# Patient Record
Sex: Male | Born: 1995 | Race: White | Hispanic: No | Marital: Single | Smoking: Current every day smoker
Health system: Southern US, Community
[De-identification: ages and names within clinical notes are randomized; demographics above are authoritative.]

---
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
## 2016-02-17 ENCOUNTER — Ambulatory Visit (HOSPITAL_COMMUNITY)
Admission: EM | Admit: 2016-02-17 | Discharge: 2016-02-17 | Disposition: A | Discharge status: Home / Self Care | Payer: 59 | Attending: Family Medicine | Admitting: Family Medicine

## 2016-02-17 ENCOUNTER — Encounter (HOSPITAL_COMMUNITY): Payer: Self-pay | Admitting: *Deleted

## 2016-02-17 DIAGNOSIS — A084 Viral intestinal infection, unspecified: Secondary | ICD-10-CM | POA: Diagnosis not present

## 2016-02-17 MED ORDER — DIPHENOXYLATE-ATROPINE 2.5-0.025 MG PO TABS: 2.0000 | ORAL_TABLET | Freq: Four times a day (QID) | ORAL | 0 refills | Status: DC | PRN

## 2016-02-17 MED ORDER — ONDANSETRON 4 MG PO TBDP
ORAL_TABLET | ORAL | Status: AC
  Filled 2016-02-17: qty 2

## 2016-02-17 MED ORDER — ONDANSETRON 4 MG PO TBDP
8.0000 mg | ORAL_TABLET | Freq: Once | ORAL | Status: AC
  Administered 2016-02-17: 8 mg via ORAL

## 2016-02-17 MED ORDER — ONDANSETRON HCL 4 MG PO TABS: 4.0000 mg | ORAL_TABLET | Freq: Four times a day (QID) | ORAL | 0 refills | Status: DC

## 2016-02-17 MED FILL — DIPHENOXYLATE-ATROP 2.5-0.0: 2.5-0.025 | 2 days supply | Qty: 20 | Fill #0

## 2016-02-17 MED FILL — ONDANSETRON HCL 4 MG TABLET: 4 | 2 days supply | Qty: 6 | Fill #0

## 2016-02-17 NOTE — ED Triage Notes (Signed)
Pt  Has  Symptoms  Of  Nausea  Vomiting /  Diarrhea  With  Onset  Of     Symptoms      yest     No active  Vomiting noted  At this  Time  Pt  Is   Sitting upright  On  The  Exam table  Speaking  In   complete sentannces   In no severe  Acute  Distress

## 2016-02-17 NOTE — Discharge Instructions (Signed)
Clear liquid , bland diet tonight as tolerated, advance on wed as improved, use medicine as needed, return or see your doctor if any problems. °

## 2016-02-17 NOTE — ED Provider Notes (Signed)
[facility]    CSN: [id number] Arrival date & time: 02/17/16  1305     History   Chief Complaint Chief Complaint  Patient presents with  . Nausea    HPI Jenelle MagesDylan M Gootee is a 20 y.o. male.   The history is provided by the patient.  Emesis  Severity:  Mild Duration:  12 hours Quality:  Stomach contents Progression:  Improving Chronicity:  New Relieved by:  Nothing Worsened by:  Nothing Ineffective treatments:  None tried Associated symptoms: diarrhea   Associated symptoms: no abdominal pain, no cough and no fever   Risk factors: sick contacts   Risk factors comment:  Sev sick family members.   History reviewed. No pertinent past medical history.  There are no active problems to display for this patient.   No past surgical history on file.     Home Medications    Prior to Admission medications   Not on File    Family History No family history on file.  Social History Social History  Substance Use Topics  . Smoking status: Not on file  . Smokeless tobacco: Not on file  . Alcohol use Not on file     Allergies   Review of patient's allergies indicates no known allergies.   Review of Systems Review of Systems  Constitutional: Negative for fever.  Respiratory: Negative for cough.   Gastrointestinal: Positive for diarrhea, nausea and vomiting. Negative for abdominal pain and blood in stool.     Physical Exam Triage Vital Signs ED Triage Vitals [02/17/16 1336]  Enc Vitals Group     BP 132/78     Pulse Rate 78     Resp 18     Temp 98.6 F (37 C)     Temp Source Oral     SpO2 98 %     Weight      Height      Head Circumference      Peak Flow      Pain Score      Pain Loc      Pain Edu?      Excl. in GC?    No data found.   Updated Vital Signs BP 132/78 (BP Location: Right Arm)   Pulse 78   Temp 98.6 F (37 C) (Oral)   Resp 18   SpO2 98%   Visual Acuity Right Eye Distance:   Left Eye Distance:   Bilateral  Distance:    Right Eye Near:   Left Eye Near:    Bilateral Near:     Physical Exam  Constitutional: He appears well-developed and well-nourished.  HENT:  Mouth/Throat: Oropharynx is clear and moist.  Cardiovascular: Normal rate, regular rhythm and normal heart sounds.   Pulmonary/Chest: Effort normal and breath sounds normal.  Abdominal: Soft. Bowel sounds are normal. He exhibits no distension and no mass. There is no tenderness. There is no guarding.  Lymphadenopathy:    He has no cervical adenopathy.  Skin: Skin is warm and dry.  Nursing note and vitals reviewed.    UC Treatments / Results  Labs (all labs ordered are listed, but only abnormal results are displayed) Labs Reviewed - No data to display  EKG  EKG Interpretation None       Radiology No results found.  Procedures Procedures (including critical care time)  Medications Ordered in UC Medications - No data to display   Initial Impression / Assessment and Plan / UC Course  I have reviewed  the triage vital signs and the nursing notes.  Pertinent labs & imaging results that were available during my care of the patient were reviewed by me and considered in my medical decision making (see chart for details).  Clinical Course      Final Clinical Impressions(s) / UC Diagnoses   Final diagnoses:  None    New Prescriptions New Prescriptions   No medications on file     Linna Hoff, MD 02/17/16 [phone]

## 2016-04-20 ENCOUNTER — Emergency Department (HOSPITAL_COMMUNITY)
Admission: EM | Admit: 2016-04-20 | Discharge: 2016-04-20 | Disposition: A | Discharge status: Home / Self Care | Payer: 59 | Attending: Emergency Medicine | Admitting: Emergency Medicine

## 2016-04-20 ENCOUNTER — Encounter (HOSPITAL_COMMUNITY): Payer: Self-pay | Admitting: Emergency Medicine

## 2016-04-20 DIAGNOSIS — R1012 Left upper quadrant pain: Secondary | ICD-10-CM | POA: Insufficient documentation

## 2016-04-20 DIAGNOSIS — Z5321 Procedure and treatment not carried out due to patient leaving prior to being seen by health care provider: Secondary | ICD-10-CM | POA: Insufficient documentation

## 2016-04-20 DIAGNOSIS — F17200 Nicotine dependence, unspecified, uncomplicated: Secondary | ICD-10-CM | POA: Diagnosis not present

## 2016-04-20 DIAGNOSIS — R1110 Vomiting, unspecified: Secondary | ICD-10-CM | POA: Diagnosis not present

## 2016-04-20 DIAGNOSIS — R197 Diarrhea, unspecified: Secondary | ICD-10-CM | POA: Insufficient documentation

## 2016-04-20 LAB — COMPREHENSIVE METABOLIC PANEL
ALT: 27 U/L (ref 17–63)
AST: 25 U/L (ref 15–41)
Albumin: 4.7 g/dL (ref 3.5–5.0)
Alkaline Phosphatase: 58 U/L (ref 38–126)
Anion gap: 8 (ref 5–15)
BILIRUBIN TOTAL: 1 mg/dL (ref 0.3–1.2)
BUN: 9 mg/dL (ref 6–20)
CO2: 27 mmol/L (ref 22–32)
CREATININE: 1.01 mg/dL (ref 0.61–1.24)
Calcium: 9.8 mg/dL (ref 8.9–10.3)
Chloride: 105 mmol/L (ref 101–111)
Glucose, Bld: 92 mg/dL (ref 65–99)
POTASSIUM: 3.7 mmol/L (ref 3.5–5.1)
Sodium: 140 mmol/L (ref 135–145)
TOTAL PROTEIN: 7.3 g/dL (ref 6.5–8.1)

## 2016-04-20 LAB — CBC
HCT: 46.8 % (ref 39.0–52.0)
Hemoglobin: 16.6 g/dL (ref 13.0–17.0)
MCH: 30.6 pg (ref 26.0–34.0)
MCHC: 35.5 g/dL (ref 30.0–36.0)
MCV: 86.2 fL (ref 78.0–100.0)
PLATELETS: 267 10*3/uL (ref 150–400)
RBC: 5.43 MIL/uL (ref 4.22–5.81)
RDW: 12.3 % (ref 11.5–15.5)
WBC: 11 10*3/uL — AB (ref 4.0–10.5)

## 2016-04-20 LAB — LIPASE, BLOOD: Lipase: 21 U/L (ref 11–51)

## 2016-04-20 NOTE — ED Triage Notes (Signed)
Patient presents with multiple complaints : chronic intermittent emesis with acid reflux for >1 month , LUQ pain this week , bloody emesis today with diarrhea and fatigue , denies fever or chills .

## 2016-04-20 NOTE — ED Notes (Signed)
Pt friend at desk wanting to know about wait time. Updated on status and about labs being ran and such. Stated that they would not keep waiting.

## 2016-04-20 NOTE — ED Notes (Signed)
Pt friend at desk again asking for an update. Updated on wait times, pt walked out and stated that he was "leaving to charge his mom".

## 2016-04-21 DIAGNOSIS — K219 Gastro-esophageal reflux disease without esophagitis: Secondary | ICD-10-CM | POA: Diagnosis not present

## 2016-04-21 DIAGNOSIS — R634 Abnormal weight loss: Secondary | ICD-10-CM | POA: Diagnosis not present

## 2016-04-21 DIAGNOSIS — R112 Nausea with vomiting, unspecified: Secondary | ICD-10-CM | POA: Diagnosis not present

## 2016-04-29 DIAGNOSIS — R112 Nausea with vomiting, unspecified: Secondary | ICD-10-CM | POA: Diagnosis not present

## 2016-04-29 DIAGNOSIS — K208 Other esophagitis: Secondary | ICD-10-CM | POA: Diagnosis not present

## 2016-04-29 DIAGNOSIS — R634 Abnormal weight loss: Secondary | ICD-10-CM | POA: Diagnosis not present

## 2016-04-29 DIAGNOSIS — K219 Gastro-esophageal reflux disease without esophagitis: Secondary | ICD-10-CM | POA: Diagnosis not present

## 2016-05-03 ENCOUNTER — Emergency Department (HOSPITAL_COMMUNITY): Payer: 59

## 2016-05-03 ENCOUNTER — Encounter (HOSPITAL_COMMUNITY): Payer: Self-pay | Admitting: Emergency Medicine

## 2016-05-03 ENCOUNTER — Emergency Department (HOSPITAL_COMMUNITY)
Admission: EM | Admit: 2016-05-03 | Discharge: 2016-05-04 | Disposition: A | Discharge status: Home / Self Care | Payer: 59 | Attending: Emergency Medicine | Admitting: Emergency Medicine

## 2016-05-03 DIAGNOSIS — S62306A Unspecified fracture of fifth metacarpal bone, right hand, initial encounter for closed fracture: Secondary | ICD-10-CM

## 2016-05-03 DIAGNOSIS — Y939 Activity, unspecified: Secondary | ICD-10-CM | POA: Insufficient documentation

## 2016-05-03 DIAGNOSIS — S62326A Displaced fracture of shaft of fifth metacarpal bone, right hand, initial encounter for closed fracture: Secondary | ICD-10-CM | POA: Diagnosis not present

## 2016-05-03 DIAGNOSIS — W2201XA Walked into wall, initial encounter: Secondary | ICD-10-CM | POA: Diagnosis not present

## 2016-05-03 DIAGNOSIS — F1910 Other psychoactive substance abuse, uncomplicated: Secondary | ICD-10-CM

## 2016-05-03 DIAGNOSIS — Y929 Unspecified place or not applicable: Secondary | ICD-10-CM | POA: Diagnosis not present

## 2016-05-03 DIAGNOSIS — Z79899 Other long term (current) drug therapy: Secondary | ICD-10-CM | POA: Insufficient documentation

## 2016-05-03 DIAGNOSIS — S62354A Nondisplaced fracture of shaft of fourth metacarpal bone, right hand, initial encounter for closed fracture: Secondary | ICD-10-CM | POA: Diagnosis not present

## 2016-05-03 DIAGNOSIS — S62356A Nondisplaced fracture of shaft of fifth metacarpal bone, right hand, initial encounter for closed fracture: Secondary | ICD-10-CM | POA: Diagnosis not present

## 2016-05-03 DIAGNOSIS — S6991XA Unspecified injury of right wrist, hand and finger(s), initial encounter: Secondary | ICD-10-CM | POA: Diagnosis present

## 2016-05-03 DIAGNOSIS — F1994 Other psychoactive substance use, unspecified with psychoactive substance-induced mood disorder: Secondary | ICD-10-CM

## 2016-05-03 DIAGNOSIS — F10129 Alcohol abuse with intoxication, unspecified: Secondary | ICD-10-CM | POA: Diagnosis not present

## 2016-05-03 DIAGNOSIS — F17200 Nicotine dependence, unspecified, uncomplicated: Secondary | ICD-10-CM | POA: Insufficient documentation

## 2016-05-03 DIAGNOSIS — Y999 Unspecified external cause status: Secondary | ICD-10-CM | POA: Insufficient documentation

## 2016-05-03 DIAGNOSIS — F6381 Intermittent explosive disorder: Secondary | ICD-10-CM

## 2016-05-03 DIAGNOSIS — F10120 Alcohol abuse with intoxication, uncomplicated: Secondary | ICD-10-CM | POA: Insufficient documentation

## 2016-05-03 DIAGNOSIS — F29 Unspecified psychosis not due to a substance or known physiological condition: Secondary | ICD-10-CM | POA: Diagnosis not present

## 2016-05-03 DIAGNOSIS — F10920 Alcohol use, unspecified with intoxication, uncomplicated: Secondary | ICD-10-CM

## 2016-05-03 LAB — CBC
HEMATOCRIT: 48.8 % (ref 39.0–52.0)
Hemoglobin: 17.3 g/dL — ABNORMAL HIGH (ref 13.0–17.0)
MCH: 29.5 pg (ref 26.0–34.0)
MCHC: 35.5 g/dL (ref 30.0–36.0)
MCV: 83.3 fL (ref 78.0–100.0)
PLATELETS: 327 10*3/uL (ref 150–400)
RBC: 5.86 MIL/uL — ABNORMAL HIGH (ref 4.22–5.81)
RDW: 12.6 % (ref 11.5–15.5)
WBC: 15.9 10*3/uL — AB (ref 4.0–10.5)

## 2016-05-03 LAB — SALICYLATE LEVEL: Salicylate Lvl: <7 mg/dL (ref 2.8–30.0)

## 2016-05-03 LAB — COMPREHENSIVE METABOLIC PANEL
ALK PHOS: 71 U/L (ref 38–126)
ALT: 34 U/L (ref 17–63)
AST: 38 U/L (ref 15–41)
Albumin: 5.4 g/dL — ABNORMAL HIGH (ref 3.5–5.0)
Anion gap: 14 (ref 5–15)
BUN: 9 mg/dL (ref 6–20)
CALCIUM: 9.7 mg/dL (ref 8.9–10.3)
CO2: 21 mmol/L — ABNORMAL LOW (ref 22–32)
CREATININE: 0.87 mg/dL (ref 0.61–1.24)
Chloride: 104 mmol/L (ref 101–111)
GFR calc Af Amer: >60 mL/min (ref >60)
Glucose, Bld: 88 mg/dL (ref 65–99)
POTASSIUM: 3.6 mmol/L (ref 3.5–5.1)
Sodium: 139 mmol/L (ref 135–145)
TOTAL PROTEIN: 8.2 g/dL — AB (ref 6.5–8.1)
Total Bilirubin: 1.6 mg/dL — ABNORMAL HIGH (ref 0.3–1.2)

## 2016-05-03 LAB — ACETAMINOPHEN LEVEL: Acetaminophen (Tylenol), Serum: <10 ug/mL — ABNORMAL LOW (ref 10–30)

## 2016-05-03 LAB — ETHANOL: ALCOHOL ETHYL (B): 139 mg/dL — AB (ref <5)

## 2016-05-03 MED ORDER — LORAZEPAM 2 MG/ML IJ SOLN: 2.0000 mg | INTRAMUSCULAR | Status: DC | PRN

## 2016-05-03 MED ORDER — TRAMADOL HCL 50 MG PO TABS
50.0000 mg | ORAL_TABLET | Freq: Four times a day (QID) | ORAL | Status: DC | PRN
  Administered 2016-05-03 – 2016-05-04 (×2): 50 mg via ORAL
  Filled 2016-05-03 (×2): qty 1

## 2016-05-03 MED ORDER — OXYCODONE-ACETAMINOPHEN 5-325 MG PO TABS
2.0000 | ORAL_TABLET | Freq: Once | ORAL | Status: AC
Start: 2016-05-03 — End: 2016-05-03
  Administered 2016-05-03: 2 via ORAL
  Filled 2016-05-03: qty 2

## 2016-05-03 MED ORDER — ZIPRASIDONE MESYLATE 20 MG IM SOLR
20.0000 mg | INTRAMUSCULAR | Status: DC | PRN
Start: 2016-05-03 — End: 2016-05-04
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
## 2016-05-03 NOTE — ED Notes (Signed)
Mom is Tina Harris-[phone]

## 2016-05-03 NOTE — ED Notes (Signed)
Patient is suddenly in more pain.  Elevated patient's hand on pillows and 2 ice bags placed.  Patient rates his pain as a 10.
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
## 2016-05-03 NOTE — ED Notes (Signed)
Patient allowed nurse to draw blood. Patient said," I don't want to be the fuck here!"  "How long am I going to be here?"  [place] [employer] and security standing by.  Patient reports he has been doing ETOH, weed, and cocaine.

## 2016-05-03 NOTE — ED Notes (Signed)
Patient sleeping after visiting with his father.

## 2016-05-03 NOTE — ED Notes (Signed)
Patient resting in bed at this time. Eyes closed, even rise and fall of chest. NAD noted at this time. 

## 2016-05-03 NOTE — ED Notes (Signed)
Patient gave permission for nurse to give Mom information about his condition.

## 2016-05-03 NOTE — ED Notes (Signed)
Dad is William LemmingsBrian Rollins- [phone]

## 2016-05-03 NOTE — ED Notes (Signed)
Offered meal tray to patient but he refused.

## 2016-05-03 NOTE — ED Notes (Signed)
Patient aware that a urine sample is needed 

## 2016-05-04 ENCOUNTER — Encounter (HOSPITAL_COMMUNITY): Payer: Self-pay | Admitting: *Deleted

## 2016-05-04 ENCOUNTER — Inpatient Hospital Stay (HOSPITAL_COMMUNITY)
Admission: AD | Admit: 2016-05-04 | Discharge: 2016-05-05 | DRG: 897 | Disposition: A | Discharge status: Home / Self Care | Payer: 59 | Attending: Psychiatry | Admitting: Psychiatry

## 2016-05-04 DIAGNOSIS — F329 Major depressive disorder, single episode, unspecified: Secondary | ICD-10-CM | POA: Diagnosis present

## 2016-05-04 DIAGNOSIS — F17200 Nicotine dependence, unspecified, uncomplicated: Secondary | ICD-10-CM | POA: Diagnosis present

## 2016-05-04 DIAGNOSIS — S62356A Nondisplaced fracture of shaft of fifth metacarpal bone, right hand, initial encounter for closed fracture: Secondary | ICD-10-CM | POA: Diagnosis not present

## 2016-05-04 DIAGNOSIS — F19980 Other psychoactive substance use, unspecified with psychoactive substance-induced anxiety disorder: Secondary | ICD-10-CM | POA: Diagnosis not present

## 2016-05-04 DIAGNOSIS — W2209XD Striking against other stationary object, subsequent encounter: Secondary | ICD-10-CM | POA: Diagnosis not present

## 2016-05-04 DIAGNOSIS — F29 Unspecified psychosis not due to a substance or known physiological condition: Secondary | ICD-10-CM | POA: Diagnosis not present

## 2016-05-04 DIAGNOSIS — F1210 Cannabis abuse, uncomplicated: Secondary | ICD-10-CM | POA: Diagnosis not present

## 2016-05-04 DIAGNOSIS — S62354A Nondisplaced fracture of shaft of fourth metacarpal bone, right hand, initial encounter for closed fracture: Secondary | ICD-10-CM | POA: Diagnosis not present

## 2016-05-04 DIAGNOSIS — F1994 Other psychoactive substance use, unspecified with psychoactive substance-induced mood disorder: Secondary | ICD-10-CM | POA: Diagnosis present

## 2016-05-04 DIAGNOSIS — Z79899 Other long term (current) drug therapy: Secondary | ICD-10-CM

## 2016-05-04 DIAGNOSIS — F19920 Other psychoactive substance use, unspecified with intoxication, uncomplicated: Secondary | ICD-10-CM | POA: Clinically undetermined

## 2016-05-04 DIAGNOSIS — F1010 Alcohol abuse, uncomplicated: Secondary | ICD-10-CM | POA: Clinically undetermined

## 2016-05-04 DIAGNOSIS — F10120 Alcohol abuse with intoxication, uncomplicated: Secondary | ICD-10-CM | POA: Diagnosis not present

## 2016-05-04 DIAGNOSIS — F1410 Cocaine abuse, uncomplicated: Secondary | ICD-10-CM | POA: Diagnosis not present

## 2016-05-04 DIAGNOSIS — S62309A Unspecified fracture of unspecified metacarpal bone, initial encounter for closed fracture: Secondary | ICD-10-CM | POA: Diagnosis present

## 2016-05-04 DIAGNOSIS — S62306A Unspecified fracture of fifth metacarpal bone, right hand, initial encounter for closed fracture: Secondary | ICD-10-CM | POA: Diagnosis not present

## 2016-05-04 DIAGNOSIS — S62309D Unspecified fracture of unspecified metacarpal bone, subsequent encounter for fracture with routine healing: Secondary | ICD-10-CM

## 2016-05-04 DIAGNOSIS — S62304A Unspecified fracture of fourth metacarpal bone, right hand, initial encounter for closed fracture: Secondary | ICD-10-CM | POA: Diagnosis not present

## 2016-05-04 DIAGNOSIS — F6381 Intermittent explosive disorder: Secondary | ICD-10-CM

## 2016-05-04 DIAGNOSIS — F14159 Cocaine abuse with cocaine-induced psychotic disorder, unspecified: Secondary | ICD-10-CM | POA: Diagnosis present

## 2016-05-04 DIAGNOSIS — Y906 Blood alcohol level of 120-199 mg/100 ml: Secondary | ICD-10-CM | POA: Diagnosis present

## 2016-05-04 DIAGNOSIS — F1910 Other psychoactive substance abuse, uncomplicated: Secondary | ICD-10-CM

## 2016-05-04 LAB — RAPID URINE DRUG SCREEN, HOSP PERFORMED
AMPHETAMINES: NOT DETECTED
BENZODIAZEPINES: NOT DETECTED
Barbiturates: NOT DETECTED
COCAINE: POSITIVE — AB
OPIATES: NOT DETECTED
TETRAHYDROCANNABINOL: POSITIVE — AB

## 2016-05-04 MED ORDER — IBUPROFEN 800 MG PO TABS
800.0000 mg | ORAL_TABLET | Freq: Three times a day (TID) | ORAL | Status: DC
  Administered 2016-05-05: 800 mg via ORAL
  Filled 2016-05-04 (×9): qty 1

## 2016-05-04 MED ORDER — ACETAMINOPHEN 325 MG PO TABS: 650.0000 mg | ORAL_TABLET | Freq: Four times a day (QID) | ORAL | Status: DC | PRN

## 2016-05-04 MED ORDER — MAGNESIUM HYDROXIDE 400 MG/5ML PO SUSP: 30.0000 mL | Freq: Every day | ORAL | Status: DC | PRN

## 2016-05-04 MED ORDER — TRAMADOL HCL 50 MG PO TABS
50.0000 mg | ORAL_TABLET | Freq: Four times a day (QID) | ORAL | Status: DC | PRN
  Administered 2016-05-04 – 2016-05-05 (×2): 50 mg via ORAL
  Filled 2016-05-04 (×2): qty 1

## 2016-05-04 MED ORDER — ALUM & MAG HYDROXIDE-SIMETH 200-200-20 MG/5ML PO SUSP: 30.0000 mL | ORAL | Status: DC | PRN

## 2016-05-04 MED ORDER — GABAPENTIN 100 MG PO CAPS
200.0000 mg | ORAL_CAPSULE | Freq: Two times a day (BID) | ORAL | Status: DC
  Administered 2016-05-04: 200 mg via ORAL
  Filled 2016-05-04: qty 2

## 2016-05-04 MED ORDER — IBUPROFEN 600 MG PO TABS
600.0000 mg | ORAL_TABLET | Freq: Four times a day (QID) | ORAL | Status: DC
  Administered 2016-05-04: 600 mg via ORAL
  Filled 2016-05-04 (×4): qty 1
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
## 2016-05-04 NOTE — [facility] Suicide Risk Assessment (Signed)
[facility] INPATIENT:  Family/Significant Other Suicide Prevention Education  Suicide Prevention Education:  Education Completed; William LemmingsBrian Delancey (pt's father) [phone] has been identified by the patient as the family member/significant other with whom the patient will be residing, and identified as the person(s) who will aid the patient in the event of a mental health crisis (suicidal ideations/suicide attempt).  With written consent from the patient, the family member/significant other has been provided the following suicide prevention education, prior to the and/or following the discharge of the patient.  The suicide prevention education provided includes the following:  Suicide risk factors  Suicide prevention and interventions  National Suicide Hotline telephone number  [facility] assessment telephone number  [facility] Emergency Assistance 911  [facility] and/or [facility] telephone number  Request made of family/significant other to:  Remove weapons (e.g., guns, rifles, knives), all items previously/currently identified as safety concern.    Remove drugs/medications (over-the-counter, prescriptions, illicit drugs), all items previously/currently identified as a safety concern.  The family member/significant other verbalizes understanding of the suicide prevention education information provided.  The family member/significant other agrees to remove the items of safety concern listed above.  Pt's father reports that he is comfortable with patient discharging on Wednesday and reports no concerns regarding SI/HI or behavioral issues. "He's a good kid. He made a mistake and he knows it." Patient's father is willing to pick him up after lunch on Wednesday. He is aware that pt is not interested in outpatient treatment. Patient has been given [employer] information and hospital note for work.   Natallie Ravenscroft N Smart LCSW 05/04/2016, 2:37 PM
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
## 2016-05-04 NOTE — ED Notes (Addendum)
[employer] called for transport to behavioral health
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
## 2016-05-04 NOTE — BH Assessment (Signed)
BHH Assessment Progress Note  Per Thedore MinsMojeed Akintayo, MD, this pt requires psychiatric hospitalization.  Berneice Heinrichina Tate, RN, [place] [facility]  Pt presents under IVC initiated by his father, and upheld by EDP Mancel BaleElliott Wentz, MD, and [facility].  Pt's nurse has been notified, and agrees to call report to [phone].  Pt is to be transported via [employer].   Doylene Canninghomas Katilin Raynes, MA Triage Specialist [phone]

## 2016-05-04 NOTE — ED Notes (Signed)
Patient up, awake alert and oriented at this time. Patient ambulatory to and from restroom without deficit. No needs voiced at this time.

## 2016-05-04 NOTE — ED Notes (Signed)
Patient ambulatory with [employer] at discharge to behavioral health.

## 2016-05-04 NOTE — ED Notes (Signed)
Patient remains asleep, eyes closed. Even rise and fall of chest. NAD noted at this time

## 2016-05-04 NOTE — Progress Notes (Signed)
Recreation Therapy Notes  Animal-Assisted Activity (AAA) Program Checklist/Progress Notes Patient Eligibility Criteria Checklist & Daily Group note for Rec TxIntervention  Date: 01.02.2018 Time: 2:45pm [address]    AAA/T Program Assumption of Risk Form signed by Patient/ or Parent Legal Guardian Yes  Patient is free of allergies or sever asthma Yes  Patient reports no fear of animals Yes  Patient reports no history of cruelty to animals Yes  Patient understands his/her participation is voluntary Yes  Patient washes hands before animal contact Yes  Patient washes hands after animal contact Yes  Behavioral Response: Appropriate   Education:Hand Washing, Appropriate Animal Interaction   Education Outcome: Acknowledges education.   Clinical Observations/Feedback: Patient attended session and interacted appropriately with therapy dog and peers.    [name], LRT/CTRS         William Rollins 05/04/2016 3:00 PM
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
## 2016-05-04 NOTE — ED Notes (Signed)
Patient provided phone at this time

## 2016-05-05 DIAGNOSIS — S62304A Unspecified fracture of fourth metacarpal bone, right hand, initial encounter for closed fracture: Secondary | ICD-10-CM | POA: Diagnosis not present

## 2016-05-05 DIAGNOSIS — S62306A Unspecified fracture of fifth metacarpal bone, right hand, initial encounter for closed fracture: Secondary | ICD-10-CM | POA: Diagnosis not present

## 2016-05-05 DIAGNOSIS — F17210 Nicotine dependence, cigarettes, uncomplicated: Secondary | ICD-10-CM

## 2016-05-05 DIAGNOSIS — Z8249 Family history of ischemic heart disease and other diseases of the circulatory system: Secondary | ICD-10-CM

## 2016-05-05 NOTE — Progress Notes (Signed)
Patient ID: [name], male   DOB: December 18, 1995, 21 y.o.   MRN: [id number]  DAR/Discharge Note- Pt. Denies SI/HI and A/V hallucinations. Belongings returned to patient at time of discharge. Patient rates depression, hopelessness, and anxiety 0/10. He reports sleep was fair and appetite is good. Discharge instructions were reviewed with patient. Patient verbalized understanding of discharge instructions. Patient encouraged to continue icing his hand and to return to Emergency Dept if any issues occur. Patient discharged to lobby where his ride was waiting. Q15 minute safety checks maintained until discharge. No distress upon discharge.

## 2016-05-05 NOTE — Tx Team (Signed)
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 acetaminophen (TYLENOL) tablet 650 mg  650 mg Oral Q6H PRN Patrecia Pour, NP      . alum & mag hydroxide-simeth (MAALOX/MYLANTA) 200-200-20 MG/5ML suspension 30 mL  30 mL Oral Q4H PRN Patrecia Pour, NP      . ibuprofen (ADVIL,MOTRIN) tablet 800 mg  800 mg Oral Q8H Benjamine Mola, FNP   800 mg at 05/05/16 7341  . magnesium hydroxide (MILK OF MAGNESIA) suspension 30 mL  30 mL Oral Daily PRN Patrecia Pour, NP      . traMADol Veatrice Bourbon) tablet 50 mg  50 mg Oral Q6H PRN Benjamine Mola, FNP   50 mg at 05/05/16 9379   PTA Medications: Prescriptions Prior to Admission  Medication Sig Dispense Refill Last Dose  . diphenoxylate-atropine (LOMOTIL) 2.5-0.025 MG tablet Take 2 tablets by mouth 4 (four) times daily as needed for diarrhea or loose stools. (Patient not taking: Reported on 05/03/2016) 20 tablet 0 Not Taking at Unknown time  . ondansetron (ZOFRAN) 4 MG tablet Take 1 tablet (4 mg total) by mouth every 6 (six) hours. Prn n/v. (Patient not taking: Reported on 05/03/2016) 6 tablet 0 Not Taking at Unknown time    Patient Stressors: Health problems Marital or family conflict Substance abuse  Patient Strengths: Ability for insight Curator fund of knowledge Supportive family/friends  Treatment Modalities: Medication  Management, Group therapy, Case management,  1 to 1 session with clinician, Psychoeducation, Recreational therapy.   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  Therapeutic Interventions: 1 to 1 sessions, Unit Group sessions and Medication administration.  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  Therapeutic Interventions: 1 to 1 sessions, Unit Group sessions and Medication administration.  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 RN will report any adverse and/or side effects to prescribing provider.  Therapeutic Interventions: 1  on 1 counseling sessions, Psychoeducation, Medication administration, Evaluate responses to treatment,  Monitor vital signs and CBGs as ordered, Perform/monitor CIWA, COWS, AIMS and Fall Risk screenings as ordered, Perform wound care treatments as ordered.  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  Short Term Goals: Engage patient in aftercare planning with referrals and resources, Facilitate patient progression through stages of change regarding substance use diagnoses and concerns and Identify triggers associated with mental health/substance abuse issues  Therapeutic Interventions: Assess for all discharge needs, 1 to 1 time with Social worker, Explore available resources and support systems, Assess for adequacy in community support network, Educate family and significant other(s) on suicide prevention, Complete Psychosocial Assessment, Interpersonal group therapy.  Evaluation of Outcomes: Met   Progress in Treatment: Attending groups: Yes Participating in groups: Yes Taking medication as prescribed: No. Toleration medication: No. Family/Significant other contact made: Yes, SPE and collateral information with pt's father, who reports that he is supportive of pt discharging today and will be picking him up. His father reports no concerns regarding SI/HI or aggression issues and is comfortable with patient returning home today.  Patient understands diagnosis: Yes. Discussing patient identified problems/goals with staff: Yes. Medical problems stabilized or resolved: Yes. Denies suicidal/homicidal ideation: Yes. Issues/concerns per patient self-inventory: No. Other: n./a  New problem(s) identified: No, Describe:  n/a  New Short Term/Long Term Goal(s): medication management; detox; development of comprehensive mental  wellness/sobriety plan.   Discharge Plan or Barriers: pt declined all follow-up. He was receptive to Mental [employer] pamphlet and was educated by CSW about this resource and other community resources. He plans to return home with his father and was provided with hospital note for work.   Reason for Continuation of Hospitalization: none  Estimated Length of Stay: d/c today after lunch. Pt's father will be picking him up.   Attendees: Patient: 05/05/2016 8:58 AM  Physician: Dr. Cobos MD 05/05/2016 8:58 AM  Nursing: Steve; Christa RN 05/05/2016 8:58 AM  RN Care Manager: 05/05/2016 8:58 AM  Social Worker:  Smart, LCSW; Lauren Newton LCSW 05/05/2016 8:58 AM  Recreational Therapist:  05/05/2016 8:58 AM  Other: May Augustin NP 05/05/2016 8:58 AM  Other:  05/05/2016 8:58 AM  Other: 05/05/2016 8:58 AM    Scribe for Treatment Team:  N Smart, LCSW 05/05/2016 8:58 AM 

## 2016-05-05 NOTE — Tx Team (Signed)
Initial Treatment Plan 05/05/2016 4:32 AM [name] ZOX:[id number]    PATIENT STRESSORS: Health problems Marital or family conflict Substance abuse   PATIENT STRENGTHS: Ability for insight Communication skills General fund of knowledge Supportive family/friends   PATIENT IDENTIFIED PROBLEMS: Substance use  Anxiety  Mood lability - angry outburts  "my hand is broken, it hurts"               DISCHARGE CRITERIA:  Ability to meet basic life and health needs Improved stabilization in mood, thinking, and/or behavior Reduction of life-threatening or endangering symptoms to within safe limits Safe-care adequate arrangements made  PRELIMINARY DISCHARGE PLAN: Attend 12-step recovery group Outpatient therapy  PATIENT/FAMILY INVOLVEMENT: This treatment plan has been presented to and reviewed with the patient, William Rollins, and/or family member. The patient and family have been given the opportunity to ask questions and make suggestions.  Aurora Maskwyman, Remington Highbaugh E, RN 05/05/2016, 4:32 AM

## 2016-05-05 NOTE — Discharge Summary (Signed)
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 Cocaine use disorder, mild, abuse [F14.10] 05/04/2016  . Cannabis use disorder, mild, abuse [F12.10] 05/04/2016  . Alcohol use disorder, mild, abuse [F10.10] 05/04/2016  . Fracture of metacarpal [S62.309A] 05/04/2016    Past Psychiatric History: see HPI  Past Medical History: History reviewed. No pertinent past medical history. History reviewed. No pertinent surgical history. Family History:  Family History  Problem Relation Age of Onset  . Hypertension Father    Family Psychiatric  History: see HPI Social History:  History  Alcohol Use  . Yes     History  Drug Use  . Types: Marijuana    Social History   Social History  . Marital status: Single    Spouse name: N/A  . Number of children: N/A  . Years of education: N/A   Social History Main Topics  . Smoking status: Current Every Day Smoker  . Smokeless tobacco: Never Used  . Alcohol use Yes  . Drug use:     Types: Marijuana  . Sexual activity: Not Asked   Other Topics Concern  . None   Social History Narrative  . 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  Patient was treated with medications with their indications listed below  in detail under Medication List.  Medical problems were identified and treated as needed.  Home medications were restarted as appropriate.  Improvement was monitored by observation and Jenelle Mages daily report of symptom reduction.  Emotional and mental status was monitored by daily self inventory reports completed by Jenelle Mages and clinical staff.  Patient reported continued improvement, denied any new concerns.  Patient had been compliant on medications and denied side effects.  Support and encouragement was provided.    William Rollins was evaluated by the treatment team for stability and plans for continued recovery upon discharge.  Patient was offered further treatment options upon discharge including Residential, Intensive Outpatient and Outpatient treatment. Patient will follow up with agency listed below for medication management and counseling.  Encouraged patient to maintain satisfactory support network and home environment.  Advised to adhere to medication compliance and outpatient treatment follow up.  Prescriptions provided.       William Rollins motivation was an integral factor for scheduling further treatment.  Employment, transportation, bed availability, health status, family support, and any pending legal issues were also considered during patient's hospital stay.  Upon completion of this admission the patient was both mentally and medically stable for discharge denying suicidal/homicidal ideation, auditory/visual/tactile hallucinations, delusional thoughts and paranoia.      Physical Findings: AIMS: Facial and Oral Movements Muscles of Facial Expression: None, normal Lips and Perioral Area: None, normal Jaw: None, normal Tongue: None, normal,Extremity Movements Upper (arms, wrists, hands, fingers): None, normal Lower (legs, knees, ankles, toes): None,  normal, Trunk Movements Neck, shoulders, hips: None, normal, Overall Severity Severity of abnormal movements (highest  score from questions above): None, normal Incapacitation due to abnormal movements: None, normal Patient's awareness of abnormal movements (rate only patient's report): No Awareness, Dental Status Current problems with teeth and/or dentures?: No Does patient usually wear dentures?: No  CIWA:    COWS:     Musculoskeletal: Strength & Muscle Tone: within normal limits Gait & Station: normal Patient leans: N/A  Psychiatric Specialty Exam: Physical Exam  Nursing note and vitals reviewed.   ROS  Blood pressure 107/72, pulse 84, temperature 98.4 F (36.9 C), temperature source Oral, resp. rate 16, height 5' 11.26" (1.81 m), weight 100.2 kg (221 lb).Body mass index is 30.6 kg/m.   Have you used any form of tobacco in the last 30 days? (Cigarettes, Smokeless Tobacco, Cigars, and/or Pipes): Yes  Has this patient used any form of tobacco in the last 30 days? (Cigarettes, Smokeless Tobacco, Cigars, and/or Pipes) Yes, Rx given to patient  Blood Alcohol level:  Lab Results  Component Value Date   ETH 139 (H) 05/03/2016    Metabolic Disorder Labs:  No results found for: HGBA1C, MPG No results found for: PROLACTIN No results found for: CHOL, TRIG, HDL, CHOLHDL, VLDL, LDLCALC  See Psychiatric Specialty Exam and Suicide Risk Assessment completed by Attending Physician prior to discharge.  Discharge destination:  Home  Is patient on multiple antipsychotic therapies at discharge:  No   Has Patient had three or more failed trials of antipsychotic monotherapy by history:  No  Recommended Plan for Multiple Antipsychotic Therapies: NA   Allergies as of 05/05/2016   No Known Allergies     Medication List    STOP taking these medications   diphenoxylate-atropine 2.5-0.025 MG tablet Commonly known as:  LOMOTIL   ondansetron 4 MG tablet Commonly known as:  ZOFRAN      Follow-up Information    Patient declined hospital follow-up/referrals. Follow up.           Follow-up  recommendations:  Activity:  as tol Diet:  as tol  Comments:  1.  Take all your medications as prescribed.   2.  Report any adverse side effects to outpatient provider. 3.  Patient instructed to not use alcohol or illegal drugs while on prescription medicines. 4.  In the event of worsening symptoms, instructed patient to call 911, the crisis hotline or go to nearest emergency room for evaluation of symptoms.  Signed: Lindwood QuaSheila May Agustin, NP [facility] 05/05/2016, 12:33 PM   Patient seen, Suicide Assessment Completed.  Disposition Plan Reviewed

## 2016-05-05 NOTE — Progress Notes (Signed)
  [facility] :  Will you be returning to the same living situation after discharge:  Yes,  home with his father At discharge, do you have transportation home?: Yes,  pt's father Do you have the ability to pay for your medications: Yes,  mental health  Release of information consent forms completed and submitted to medical records by CSW.  Patient to Follow up at: Follow-up Information    Patient declined hospital follow-up/referrals. Follow up.           Next level of care provider [facility]: N/A  Safety Planning and Suicide Prevention discussed: Yes,  SPE completed with pt's father and with pt. SPI pamphlet and Mobile Crisis information provided to pt.  Have you used any form of tobacco in the last 30 days? (Cigarettes, Smokeless Tobacco, Cigars, and/or Pipes): Yes  Has patient been referred to the Quitline?: Patient refused referral  Patient has been referred for addiction treatment: Pt. refused referral  Ledell PeoplesHeather N Smart LCSW 05/05/2016, 9:01 AM

## 2016-05-05 NOTE — Progress Notes (Signed)
Patient ID: William Rollins, male   DOB: 30-Mar-1996, 21 y.o.   MRN: [id number]   Pt currently presents with a masked affect and restless behavior. Pt mood ambivalent to care and treatment. Pt forwards little to Clinical research associatewriter. Main complaint is pain 10/10 in his R hand. Pt states "I broke all of the bones in my hand, of course it hurts." Pt reports good sleep at home PTA and good sleep currently. Pt seen interacting positively with peers.   Pt provided with medications per providers orders. Pt's labs and vitals were monitored throughout the night. Pt supported emotionally and encouraged to express concerns and questions. Pt educated on medications. Pt given ice pack, maintained order for ice on extremity.  Pt's safety ensured with 15 minute and environmental checks. Pt currently denies SI/HI and A/V hallucinations. Pt verbally agrees to seek staff if SI/HI or A/VH occurs and to consult with staff before acting on any harmful thoughts. Pt reports he is ready to be discharged. Will continue POC.

## 2016-05-05 NOTE — [facility] Counselor (Signed)
Patient is discharging within 24 hours of admission. Therefore, PSA is not required.  Trula SladeHeather Smart, MSW, LCSW Clinical Social Worker 05/05/2016 10:10 AM

## 2016-05-05 NOTE — Progress Notes (Signed)
Recreation Therapy Notes  Date: 05/05/16 Time: 0930 Location: [address] Dayroom  Group Topic: Stress Management  Goal Area(s) Addresses:  Patient will verbalize importance of using healthy stress management.  Patient will identify positive emotions associated with healthy stress management.   Behavioral Response: Engaged  Intervention: Stress Management  Activity :  Letting Go Meditation.  LRT introduced to stress management technique of meditation.  LRT played a meditation focused on letting go of the past and focusing on the now from the Calm app.  Patients were to follow along as the meditation played to fully engage in the technique.  Education:  Stress Management, Discharge Planning.   Education Outcome: Acknowledges edcuation/In group clarification offered/Needs additional education  Clinical Observations/Feedback: Pt attended group.   Caroll RancherMarjette Delshawn Stech, LRT/CTRS         Caroll RancherLindsay, Louvinia Cumbo A 05/05/2016 11:18 AM

## 2016-05-05 NOTE — [facility] Suicide Risk Assessment (Signed)
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 Cocaine use disorder, mild, abuse [F14.10] 05/04/2016  . Cannabis use disorder, mild, abuse [F12.10] 05/04/2016  . Alcohol use disorder, mild, abuse [F10.10] 05/04/2016  . Fracture of metacarpal [S62.309A] 05/04/2016    Total Time spent with patient: 30 minutes  Musculoskeletal: Strength & Muscle Tone: within normal limits Gait & Station: normal Patient leans: N/A  Psychiatric Specialty Exam: ROS no  current severe pain on R hand/fracture site .   Blood pressure 107/72, pulse 84, temperature 98.4 F (36.9 C), temperature source Oral, resp. rate 16, height 5' 11.26" (1.81 m), weight 100.2 kg (221 lb).Body mass index is 30.6 kg/m.  General Appearance: Fairly Groomed  Patent attorney::  Good  Speech:  Normal Rate409  Volume:  Normal  Mood:  "OK", denies depression, presents euthymic  Affect:  Appropriate and Full Range  Thought Process:  Linear  Orientation:  Full (Time, Place, and Person)  Thought Content:  denies hallucinations, no delusions, not internally preoccupied   Suicidal Thoughts:  No- denies any suicidal or self injurious ideations, denies any homicidal or violent ideations   Homicidal Thoughts:  No  Memory:  recent and remote grossly intact  Judgement: improving    Insight:  Fair  Psychomotor Activity:  Normal- no tremors, no diaphoresis, no acute restlessness  Concentration:  Good  Recall:  Good  Fund of Knowledge:Good  Language: Good  Akathisia:  Negative  Handed:  Right  AIMS (if indicated):     Assets:  Desire for Improvement Resilience Social Support  Sleep:  Number of Hours: 6.5  Cognition: WNL  ADL's:  Intact    Mental Status Per Nursing Assessment::   On Admission:  Thoughts of violence towards others, Plan to harm others  Demographic Factors:  21 year old single male, plans to live with father, employed   Loss Factors: Denies   Historical Factors: Cannabis Abuse   Risk Reduction Factors:   Employed, Living with another person, especially a relative and Positive coping skills or problem solving skills  Continued Clinical Symptoms:  At this time patient is alert, attentive, mood is "OK", denies depression, presents euthymic, affect is reactive, appropriate, no thought disorder, no suicidal or self injurious ideations , denies any homicidal or violent ideations, no hallucinations, no delusions, future oriented, looking forward to returning home and to work soon. Denies any prior psychiatric decompensations , denies substance dependence other than smoking cannabis regularly . States he drinks only occasionally and had not done cocaine before . Behavior on unit calm, no disruptive or agitated behaviors  Patient states he used alcohol and drugs on New Year's Eve party, and feels that " that is why I acted like that". States that at this time he feels " fine " and back to his normal. With his express consent I have spoken with his father, who came to visit patient last night- father concurs with above, and states patient seems back to his normal, father is in agreement with discharge today.  Patient not presenting with any WDL symptoms. He states he plans to abstain from drugs , alcohol .   Cognitive Features That Contribute To Risk:  No gross cognitive deficits noted upon discharge. Is alert , attentive, and oriented x 3  Suicide Risk:  Mild:  Suicidal ideation of limited frequency, intensity, duration, and specificity.  There are no identifiable plans, no associated intent, mild dysphoria and related symptoms, good self-control (both objective and subjective assessment), few other risk factors,  and identifiable protective factors, including available and accessible social support.  Follow-up Information    Patient declined hospital follow-up/referrals. Follow up.           Plan Of Care/Follow-up recommendations:  Activity:  as tolerated  Diet:  Regular Tests:  NA Other:  See below  Patient is wanting discharge , and there are no current grounds for ongoing involuntary commitment . As above, plans to live with his father- patient's father agrees with discharge and is picking him up later today We discussed recent episode and importance of avoiding illicit drug use or excessive alcohol consumption * Plans to follow up with Orthopedist for ongoing management of R hand metacarpal fractures. Nehemiah MassedOBOS, FERNANDO, MD 05/05/2016, 10:46 AM

## 2016-05-17 DIAGNOSIS — S62304D Unspecified fracture of fourth metacarpal bone, right hand, subsequent encounter for fracture with routine healing: Secondary | ICD-10-CM | POA: Diagnosis not present

## 2016-06-07 DIAGNOSIS — S62304D Unspecified fracture of fourth metacarpal bone, right hand, subsequent encounter for fracture with routine healing: Secondary | ICD-10-CM | POA: Diagnosis not present

## 2016-06-28 DIAGNOSIS — S62304D Unspecified fracture of fourth metacarpal bone, right hand, subsequent encounter for fracture with routine healing: Secondary | ICD-10-CM | POA: Diagnosis not present

## 2017-05-29 IMAGING — CR DG HAND COMPLETE 3+V*R*
3 series · 3 of 3 positions shown · non-contrast
Comparison: None.

CLINICAL DATA: 20-year-old male with pain after punching an
unspecified object. Initial encounter.

EXAM:
RIGHT HAND - COMPLETE 3+ VIEW

[x hand pa right]
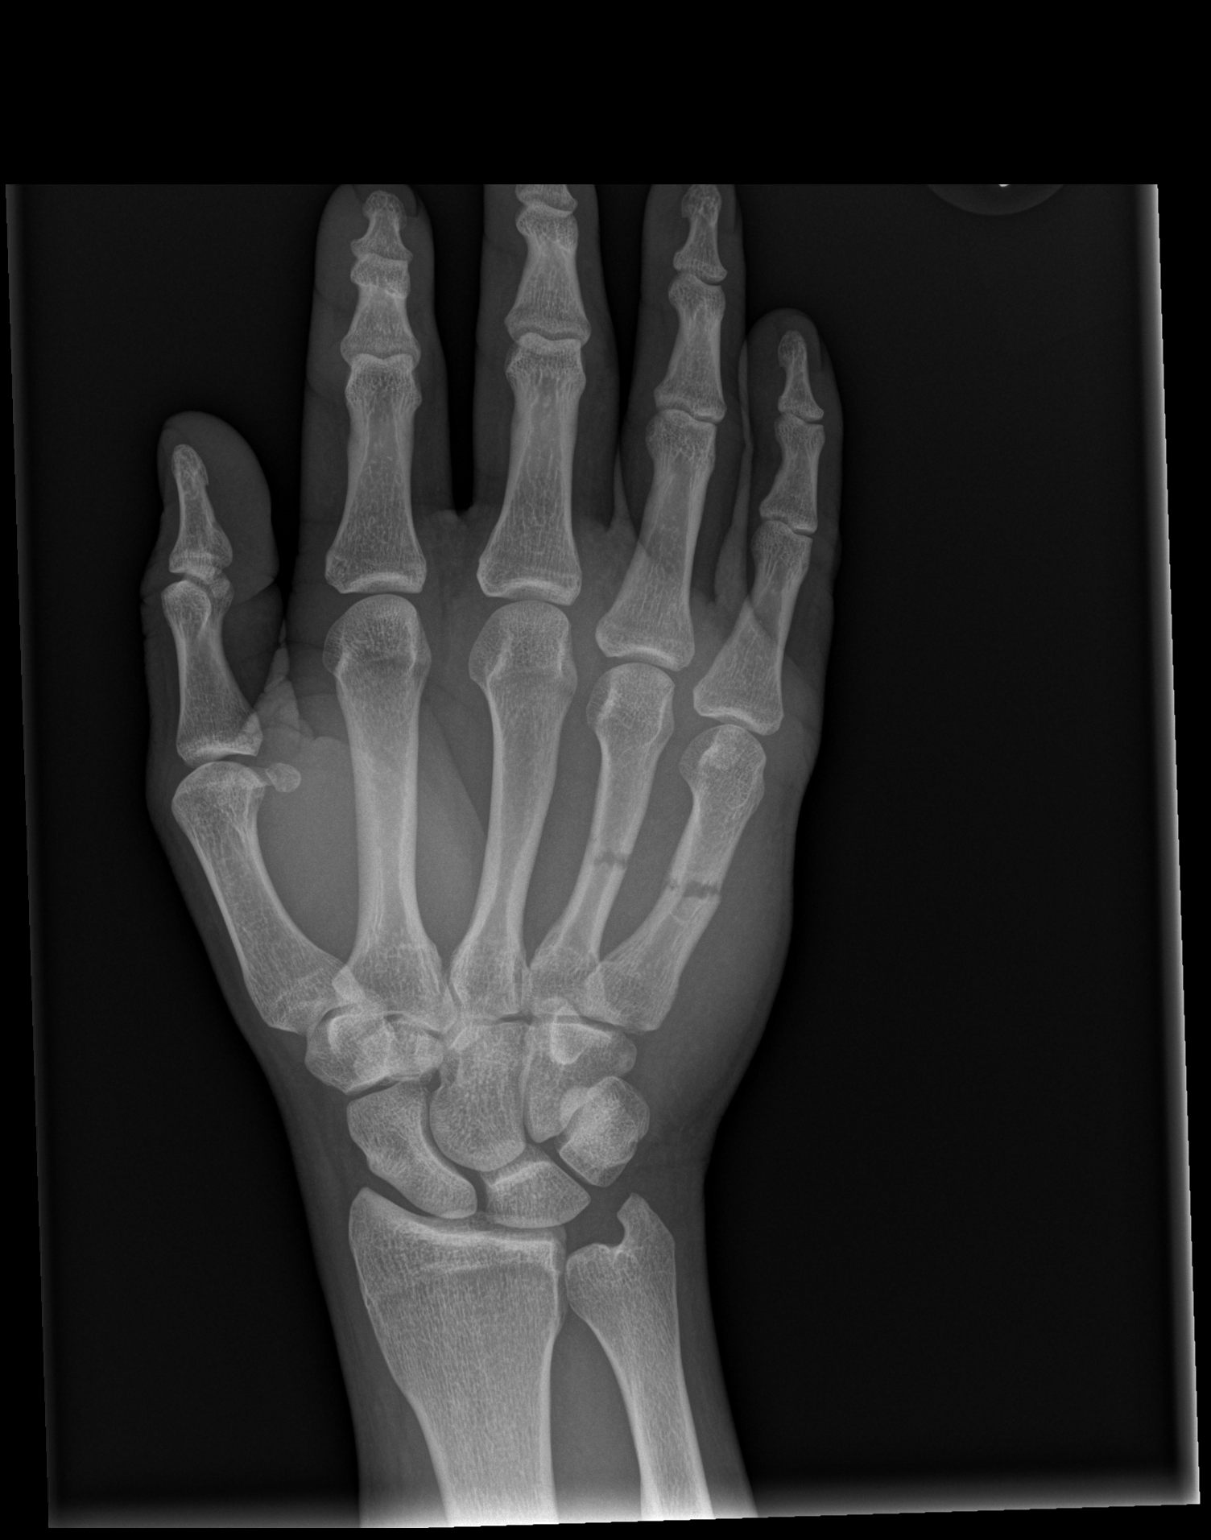

[x hand obl right]
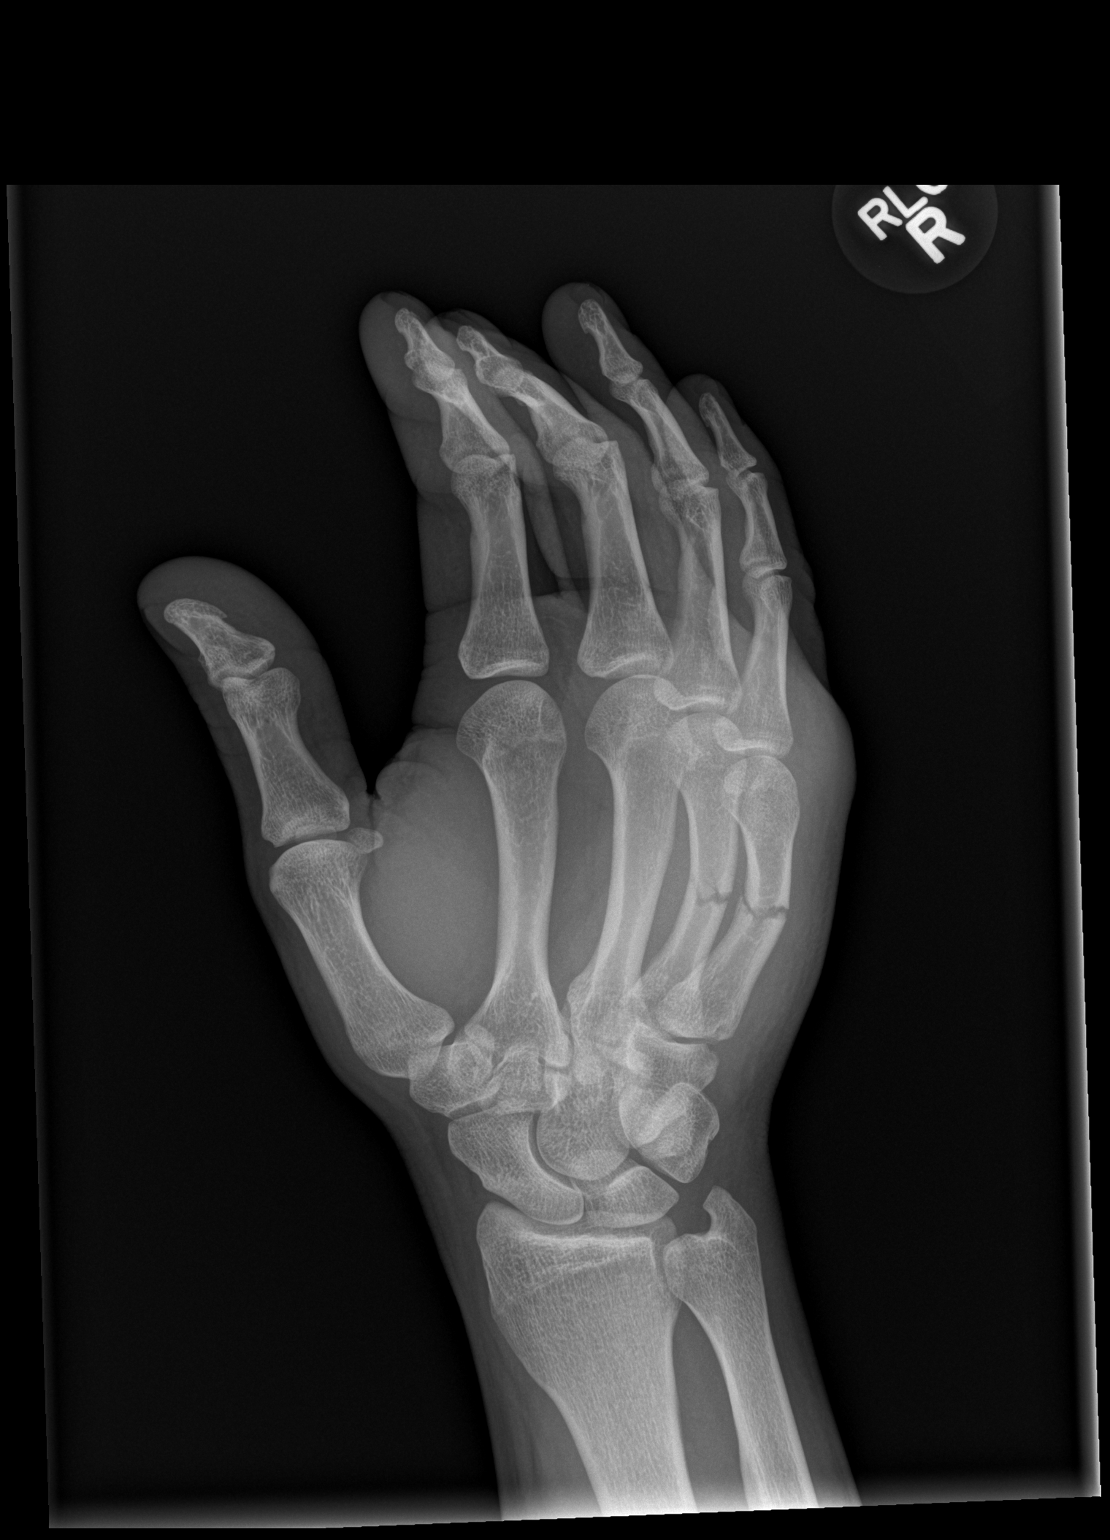

[x hand lat right]
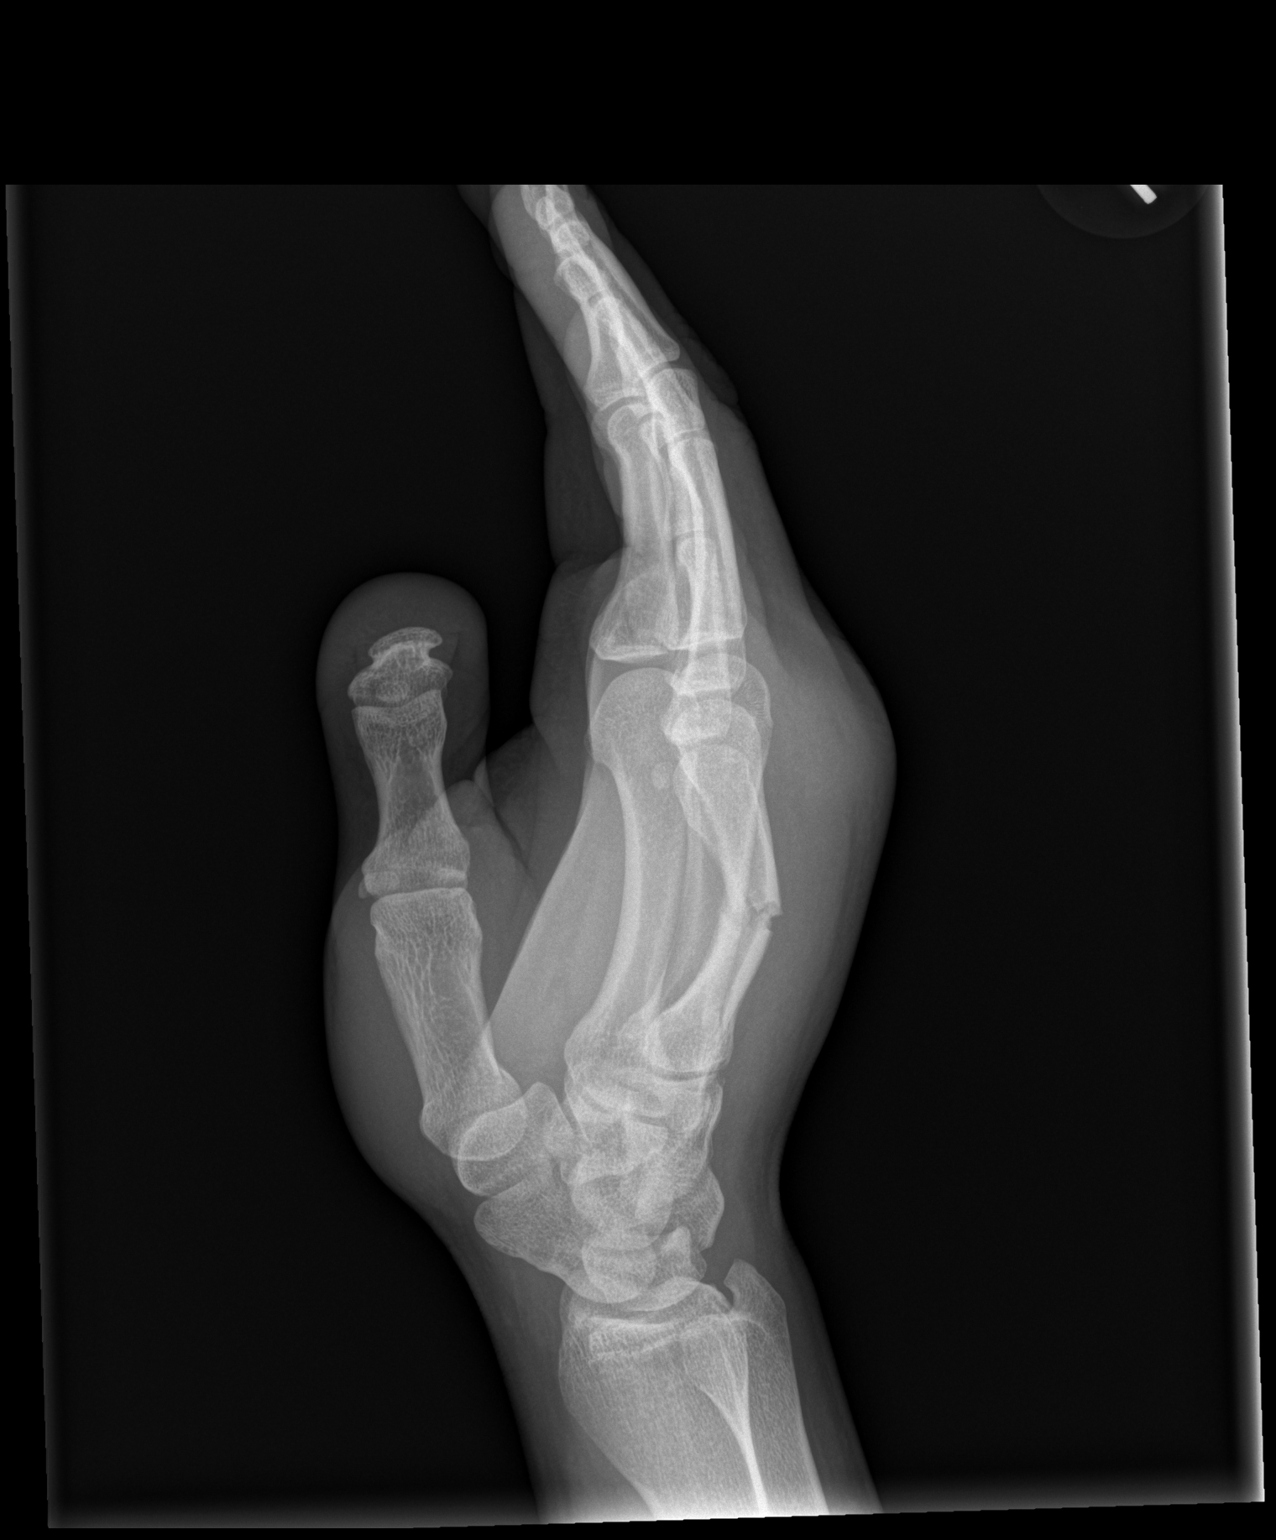

[3 of 3 positions shown; findings below may reference images not displayed]

FINDINGS: Transverse mildly comminuted fractures of both the right fourth and
fifth metacarpal mid shafts. Mild fourth and moderate fifth volar
angulation of the distal fragments. Slight radial angulation and
displacement of the fifth metacarpal fragment. Moderate to severe
regional soft tissue swelling.

Distal radius and ulna are intact. Carpal bone alignment within
normal limits. The first 3 metacarpals and all phalanges appear
intact.
IMPRESSION: Transverse mildly comminuted fractures of both the right fourth and
fifth metacarpal mid shafts. Radial and volar angulation of the
distal fifth metacarpal fragment.

## 2018-12-14 DIAGNOSIS — S81012A Laceration without foreign body, left knee, initial encounter: Secondary | ICD-10-CM | POA: Diagnosis not present

## 2022-05-06 ENCOUNTER — Ambulatory Visit
Admission: EM | Admit: 2022-05-06 | Discharge: 2022-05-06 | Disposition: A | Discharge status: Home / Self Care | Payer: Commercial Managed Care - PPO | Attending: Internal Medicine | Admitting: Internal Medicine

## 2022-05-06 DIAGNOSIS — L089 Local infection of the skin and subcutaneous tissue, unspecified: Secondary | ICD-10-CM

## 2022-05-06 MED ORDER — CEPHALEXIN 500 MG PO CAPS: 500.0000 mg | ORAL_CAPSULE | Freq: Four times a day (QID) | ORAL | 0 refills | Status: DC

## 2022-05-06 NOTE — ED Triage Notes (Signed)
Pt presents with infection to left index finger from a blister he popped on it X 4 days ago.

## 2022-05-06 NOTE — Discharge Instructions (Signed)
You have an infection of your finger.  I recommend that you follow-up with the ER if no improvement in symptoms or if they worsen in the next 48 hours.

## 2022-05-06 NOTE — ED Provider Notes (Signed)
[facility]    CSN: [id number] Arrival date & time: 05/06/22  0830      History   Chief Complaint Chief Complaint  Patient presents with   Finger Infection    HPI William Rollins is a 27 y.o. male.   Patient presents with concerns of infection to distal end of left index finger.  He reports that it started about 1 week ago with swelling, pain, erythema.  Patient denies any injury to the area.  Patient reports that he has "popped it" a few times with some purulent drainage.  He states that the swelling has significantly improved since it started.  Denies any associated fever.  Has been using warm Epsom salt soaks with some improvement.     History reviewed. No pertinent past medical history.  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 No pertinent surgical history.     Home Medications    Prior to Admission medications   Medication Sig Start Date End Date Taking? Authorizing Provider  cephALEXin (KEFLEX) 500 MG capsule Take 1 capsule (500 mg total) by mouth 4 (four) times daily. 05/06/22  Yes , Michele Rockers, FNP    Family History Family History  Problem Relation Age of Onset   Hypertension Father     Social History Social History   Tobacco Use   Smoking status: Every Day   Smokeless tobacco: Never  Substance Use Topics   Alcohol use: Yes   Drug use: Yes    Types: Marijuana     Allergies   Patient has no known allergies.   Review of Systems Review of Systems Per HPI  Physical Exam Triage Vital Signs ED Triage Vitals [05/06/22 0949]  Enc Vitals Group     BP (!) 134/92     Pulse Rate 61     Resp 18     Temp 97.7 F  (36.5 C)     Temp Source Oral     SpO2 97 %     Weight      Height      Head Circumference      Peak Flow      Pain Score 8     Pain Loc      Pain Edu?      Excl. in [place]?    No data found.  Updated Vital Signs BP (!) 134/92 (BP Location: Left Arm)   Pulse 61   Temp 97.7 F (36.5 C) (Oral)   Resp 18   SpO2 97%   Visual Acuity Right Eye Distance:   Left Eye Distance:   Bilateral Distance:    Right Eye Near:   Left Eye Near:    Bilateral Near:     Physical Exam Constitutional:      General: He is not in acute distress.    Appearance: Normal appearance. He is not toxic-appearing or diaphoretic.  HENT:     Head: Normocephalic and atraumatic.  Eyes:     Extraocular Movements: Extraocular movements intact.     Conjunctiva/sclera: Conjunctivae normal.  Pulmonary:     Effort: Pulmonary effort is normal.  Skin:    Comments: Patient has mild erythema and swelling present through distal end of  left index finger surrounding skin of the nail and pad of finger.  No purulent drainage noted or area of fluctuance.  There is some dried blood noted on the distal end of finger where patient has been draining it himself.  Capillary refill and pulses are intact.  Patient has full range of motion of finger.  Nail is intact with no abnormality.  Neurological:     General: No focal deficit present.     Mental Status: He is alert and oriented to person, place, and time. Mental status is at baseline.  Psychiatric:        Mood and Affect: Mood normal.        Behavior: Behavior normal.        Thought Content: Thought content normal.        Judgment: Judgment normal.      UC Treatments / Results  Labs (all labs ordered are listed, but only abnormal results are displayed) Labs Reviewed - No data to display  EKG   Radiology No results found.  Procedures Procedures (including critical care time)  Medications Ordered in UC Medications - No data to display  Initial Impression /  Assessment and Plan / UC Course  I have reviewed the triage vital signs and the nursing notes.  Pertinent labs & imaging results that were available during my care of the patient were reviewed by me and considered in my medical decision making (see chart for details).     Patient has infection of the skin of the distal end of the left index finger.  Will treat with cephalexin antibiotic.  Patient to continue warm Epsom salt soaks.  Finger is not conducive to being drained at this time as there is no area of fluctuance.  Patient is neurovascularly intact so do not think that emergent evaluation is necessary.  Advised patient to follow-up with the ER if symptoms persist or worsen in the next 48 hours.  Patient verbalized understanding and was agreeable with plan. Final Clinical Impressions(s) / UC Diagnoses   Final diagnoses:  Finger infection     Discharge Instructions      You have an infection of your finger.  I recommend that you follow-up with the ER if no improvement in symptoms or if they worsen in the next 48 hours.    ED Prescriptions     Medication Sig Dispense Auth. Provider   cephALEXin (KEFLEX) 500 MG capsule Take 1 capsule (500 mg total) by mouth 4 (four) times daily. 28 capsule [place], Michele Rockers, [place]      PDMP not reviewed this encounter.   Teodora Medici, [place] 05/06/22 [phone]

## 2022-05-17 ENCOUNTER — Telehealth: Payer: Commercial Managed Care - PPO | Admitting: Physician Assistant

## 2022-05-17 DIAGNOSIS — L089 Local infection of the skin and subcutaneous tissue, unspecified: Secondary | ICD-10-CM

## 2022-05-17 MED ORDER — CEPHALEXIN 500 MG PO CAPS: 500.0000 mg | ORAL_CAPSULE | Freq: Four times a day (QID) | ORAL | 0 refills | Status: DC

## 2022-05-17 NOTE — Patient Instructions (Signed)
  [place] Reek, thank you for joining Leeanne Rio, PA-C for today's virtual visit.  While this provider is not your primary care provider (PCP), if your PCP is located in our provider database this encounter information will be shared with them immediately following your visit.   [facility] account gives you access to today's visit and all your visits, tests, and labs performed at [facility] " click here if you don't have a [facility] account or go to [email or web address]  Consent: (Patient) William Rollins provided verbal consent for this virtual visit at the beginning of the encounter.  Current Medications:  Current Outpatient Medications:    cephALEXin (KEFLEX) 500 MG capsule, Take 1 capsule (500 mg total) by mouth 4 (four) times daily., Disp: 28 capsule, Rfl: 0   Medications ordered in this encounter:  No orders of the defined types were placed in this encounter.    *If you need refills on other medications prior to your next appointment, please contact your pharmacy*  Follow-Up: Call back or seek an in-person evaluation if the symptoms worsen or if the condition fails to improve as anticipated.  [facility] [phone]  Other Instructions Keep skin clean and dry. Wash with warm, clean water and some mild soap. Pat completely dry. Take the antibiotic as directed. Follow-up in person if there is anything not fully resolving or any new/worsening symptoms at all. DO NOT DELAY CARE.   If you have been instructed to have an in-person evaluation today at a local Urgent Care facility, please use the link below. It will take you to a list of all of our available [facility], including address, phone number and hours of operation. Please do not delay care.  [facility]  If you or a family member do not have a primary care provider, use the link below to schedule a visit and establish care. When  you choose a [facility] primary care physician or advanced practice provider, you gain a long-term partner in health. Find a Primary Care Provider  Learn more about [facility]'s in-office and virtual care options: [facility]

## 2022-05-17 NOTE — Progress Notes (Signed)
Virtual Visit Consent   William Rollins, you are scheduled for a virtual visit with a [facility] provider today. Just as with appointments in the office, your consent must be obtained to participate. Your consent will be active for this visit and any virtual visit you may have with one of our providers in the next 365 days. If you have a MyChart account, a copy of this consent can be sent to you electronically.  As this is a virtual visit, video technology does not allow for your provider to perform a traditional examination. This may limit your provider's ability to fully assess your condition. If your provider identifies any concerns that need to be evaluated in person or the need to arrange testing (such as labs, EKG, etc.), we will make arrangements to do so. Although advances in technology are sophisticated, we cannot ensure that it will always work on either your end or our end. If the connection with a video visit is poor, the visit may have to be switched to a telephone visit. With either a video or telephone visit, we are not always able to ensure that we have a secure connection.  By engaging in this virtual visit, you consent to the provision of healthcare and authorize for your insurance to be billed (if applicable) for the services provided during this visit. Depending on your insurance coverage, you may receive a charge related to this service.  I need to obtain your verbal consent now. Are you willing to proceed with your visit today? William Rollins has provided verbal consent on 05/17/2022 for a virtual visit (video or telephone). Leeanne Rio, [place]  Date: 05/17/2022 3:55 PM  Virtual Visit via Video Note   I, Leeanne Rio, connected with  William Rollins  ([id number], 1996-02-19) on 05/17/22 at  3:30 PM EST by a video-enabled telemedicine application and verified that I am speaking with the correct person using two identifiers.  Location: Patient: Virtual Visit  Location Patient: Home Provider: Virtual Visit Location Provider: Home Office   I discussed the limitations of evaluation and management by telemedicine and the availability of in person appointments. The patient expressed understanding and agreed to proceed.    History of Present Illness: William Rollins is a 27 y.o. who identifies as a male who was assigned male at birth, and is being seen today for follow-up regarding infection of L index finger. Patient was evaluated at [facility] on 05/06/22 with about 1 week of redness, swelling and pain to distal 2nd phalanx. Noted purulent drainage. Patient was started on QID Keflex 500 mg for 7 days. Endorses taking as directed with substantial improvement in symptoms. Notes most of swelling fully resolved as well as pain. Still with small area or erythema and warmth at distal tip of finger only. Worried about infection not being fully resolved. Denies fever, chills, malaise.   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  Resting comfortably at home.  Head is normocephalic, atraumatic.  No labored breathing. Speech is clear and coherent with logical content.  Patient is alert and oriented at baseline.  L index finger examined -- normal ROM. Some very slight distal erythema and swelling. No active drainage or open wound appreciated.  Assessment and  Plan: 1. Finger infection - cephALEXin (KEFLEX) 500 MG capsule; Take 1 capsule (500 mg total) by mouth 4 (four) times daily.  Dispense: 20 capsule; Refill: 0  Will extend course of Keflex for 5 more days just to be cautious and to make sure infection is fully resolved. Supportive care discussed. Strict in-person follow-up precautions reviewed.   Follow Up Instructions: I discussed the assessment and treatment plan with the patient. The patient was provided an opportunity to ask questions and all were answered. The patient agreed with the plan and demonstrated an understanding of the instructions.  A copy of instructions were sent to the patient via MyChart unless otherwise noted below.   The patient was advised to call back or seek an in-person evaluation if the symptoms worsen or if the condition fails to improve as anticipated.  Time:  I spent 10 minutes with the patient via telehealth technology discussing the above problems/concerns.    Leeanne Rio, PA-C

## 2022-05-27 ENCOUNTER — Encounter (HOSPITAL_COMMUNITY)
Admission: EM | Disposition: A | Discharge status: Home / Self Care | Payer: Self-pay | Source: Home / Self Care | Attending: Internal Medicine

## 2022-05-27 ENCOUNTER — Other Ambulatory Visit: Payer: Self-pay

## 2022-05-27 ENCOUNTER — Inpatient Hospital Stay (HOSPITAL_COMMUNITY)
Admission: EM | Admit: 2022-05-27 | Discharge: 2022-05-30 | DRG: 478 | Disposition: A | Discharge status: Home / Self Care | Payer: Commercial Managed Care - PPO | Attending: Internal Medicine | Admitting: Internal Medicine

## 2022-05-27 ENCOUNTER — Encounter (HOSPITAL_COMMUNITY): Payer: Self-pay

## 2022-05-27 ENCOUNTER — Observation Stay (HOSPITAL_COMMUNITY): Payer: Commercial Managed Care - PPO | Admitting: Certified Registered"

## 2022-05-27 DIAGNOSIS — E876 Hypokalemia: Secondary | ICD-10-CM | POA: Diagnosis present

## 2022-05-27 DIAGNOSIS — Z6830 Body mass index (BMI) 30.0-30.9, adult: Secondary | ICD-10-CM

## 2022-05-27 DIAGNOSIS — D72828 Other elevated white blood cell count: Secondary | ICD-10-CM | POA: Diagnosis present

## 2022-05-27 DIAGNOSIS — R234 Changes in skin texture: Secondary | ICD-10-CM | POA: Diagnosis not present

## 2022-05-27 DIAGNOSIS — M869 Osteomyelitis, unspecified: Principal | ICD-10-CM

## 2022-05-27 DIAGNOSIS — L03012 Cellulitis of left finger: Secondary | ICD-10-CM | POA: Diagnosis present

## 2022-05-27 DIAGNOSIS — I96 Gangrene, not elsewhere classified: Secondary | ICD-10-CM | POA: Diagnosis present

## 2022-05-27 DIAGNOSIS — Z8249 Family history of ischemic heart disease and other diseases of the circulatory system: Secondary | ICD-10-CM

## 2022-05-27 DIAGNOSIS — F17210 Nicotine dependence, cigarettes, uncomplicated: Secondary | ICD-10-CM | POA: Diagnosis present

## 2022-05-27 DIAGNOSIS — B9561 Methicillin susceptible Staphylococcus aureus infection as the cause of diseases classified elsewhere: Secondary | ICD-10-CM | POA: Diagnosis present

## 2022-05-27 DIAGNOSIS — E669 Obesity, unspecified: Secondary | ICD-10-CM | POA: Diagnosis present

## 2022-05-27 DIAGNOSIS — M19042 Primary osteoarthritis, left hand: Secondary | ICD-10-CM | POA: Diagnosis present

## 2022-05-27 DIAGNOSIS — L089 Local infection of the skin and subcutaneous tissue, unspecified: Secondary | ICD-10-CM | POA: Diagnosis not present

## 2022-05-27 DIAGNOSIS — S61201A Unspecified open wound of left index finger without damage to nail, initial encounter: Secondary | ICD-10-CM | POA: Diagnosis not present

## 2022-05-27 DIAGNOSIS — S61209A Unspecified open wound of unspecified finger without damage to nail, initial encounter: Secondary | ICD-10-CM | POA: Diagnosis not present

## 2022-05-27 HISTORY — PX: I & D EXTREMITY: SHX5045

## 2022-05-27 LAB — CBC
HCT: 43.8 % (ref 39.0–52.0)
Hemoglobin: 14.8 g/dL (ref 13.0–17.0)
MCH: 29.4 pg (ref 26.0–34.0)
MCHC: 33.8 g/dL (ref 30.0–36.0)
MCV: 86.9 fL (ref 80.0–100.0)
Platelets: 287 10*3/uL (ref 150–400)
RBC: 5.04 MIL/uL (ref 4.22–5.81)
RDW: 12.1 % (ref 11.5–15.5)
WBC: 13.8 10*3/uL — ABNORMAL HIGH (ref 4.0–10.5)
nRBC: 0 % (ref 0.0–0.2)

## 2022-05-27 LAB — BASIC METABOLIC PANEL
Anion gap: 11 (ref 5–15)
BUN: 17 mg/dL (ref 6–20)
CO2: 22 mmol/L (ref 22–32)
Calcium: 9.6 mg/dL (ref 8.9–10.3)
Chloride: 104 mmol/L (ref 98–111)
Creatinine, Ser: 0.77 mg/dL (ref 0.61–1.24)
GFR, Estimated: >60 mL/min (ref >60)
Glucose, Bld: 98 mg/dL (ref 70–99)
Potassium: 3.4 mmol/L — ABNORMAL LOW (ref 3.5–5.1)
Sodium: 137 mmol/L (ref 135–145)

## 2022-05-27 LAB — HIV ANTIBODY (ROUTINE TESTING W REFLEX): HIV Screen 4th Generation wRfx: NONREACTIVE

## 2022-05-27 SURGERY — IRRIGATION AND DEBRIDEMENT EXTREMITY
Anesthesia: General | Site: Hand

## 2022-05-27 MED ORDER — PROPOFOL 1000 MG/100ML IV EMUL
INTRAVENOUS | Status: AC
  Filled 2022-05-27: qty 100

## 2022-05-27 MED ORDER — FENTANYL CITRATE (PF) 100 MCG/2ML IJ SOLN
25.0000 ug | INTRAMUSCULAR | Status: DC | PRN
  Administered 2022-05-28: 50 ug via INTRAVENOUS

## 2022-05-27 MED ORDER — LACTATED RINGERS IV SOLN: INTRAVENOUS | Status: DC | PRN

## 2022-05-27 MED ORDER — ONDANSETRON HCL 4 MG/2ML IJ SOLN
INTRAMUSCULAR | Status: AC
  Filled 2022-05-27: qty 2

## 2022-05-27 MED ORDER — PROPOFOL 10 MG/ML IV BOLUS
INTRAVENOUS | Status: DC | PRN
  Administered 2022-05-27: 200 mg via INTRAVENOUS
  Administered 2022-05-28: 50 mg via INTRAVENOUS

## 2022-05-27 MED ORDER — DEXMEDETOMIDINE HCL IN NACL 80 MCG/20ML IV SOLN
INTRAVENOUS | Status: DC | PRN
  Administered 2022-05-27: 8 ug via BUCCAL
  Administered 2022-05-28: 12 ug via BUCCAL

## 2022-05-27 MED ORDER — LIDOCAINE 2% (20 MG/ML) 5 ML SYRINGE
INTRAMUSCULAR | Status: AC
  Filled 2022-05-27: qty 5

## 2022-05-27 MED ORDER — MIDAZOLAM HCL 5 MG/5ML IJ SOLN
INTRAMUSCULAR | Status: DC | PRN
  Administered 2022-05-27: 2 mg via INTRAVENOUS

## 2022-05-27 MED ORDER — SUCCINYLCHOLINE CHLORIDE 200 MG/10ML IV SOSY
PREFILLED_SYRINGE | INTRAVENOUS | Status: AC
  Filled 2022-05-27: qty 10

## 2022-05-27 MED ORDER — PROPOFOL 10 MG/ML IV BOLUS
INTRAVENOUS | Status: AC
  Filled 2022-05-27: qty 20

## 2022-05-27 MED ORDER — SUCCINYLCHOLINE CHLORIDE 200 MG/10ML IV SOSY
PREFILLED_SYRINGE | INTRAVENOUS | Status: DC | PRN
  Administered 2022-05-27: 100 mg via INTRAVENOUS

## 2022-05-27 MED ORDER — ACETAMINOPHEN 10 MG/ML IV SOLN
INTRAVENOUS | Status: DC | PRN
  Administered 2022-05-27: 1000 mg via INTRAVENOUS

## 2022-05-27 MED ORDER — LACTATED RINGERS IV SOLN: INTRAVENOUS | Status: DC

## 2022-05-27 MED ORDER — ONDANSETRON HCL 4 MG/2ML IJ SOLN: 4.0000 mg | Freq: Four times a day (QID) | INTRAMUSCULAR | Status: DC | PRN

## 2022-05-27 MED ORDER — ACETAMINOPHEN 10 MG/ML IV SOLN
INTRAVENOUS | Status: AC
  Filled 2022-05-27: qty 100

## 2022-05-27 MED ORDER — FENTANYL CITRATE (PF) 100 MCG/2ML IJ SOLN
INTRAMUSCULAR | Status: DC | PRN
  Administered 2022-05-27: 100 ug via INTRAVENOUS
  Administered 2022-05-27 – 2022-05-28 (×2): 50 ug via INTRAVENOUS

## 2022-05-27 MED ORDER — POLYETHYLENE GLYCOL 3350 17 G PO PACK: 17.0000 g | PACK | Freq: Every day | ORAL | Status: DC | PRN

## 2022-05-27 MED ORDER — FENTANYL CITRATE (PF) 250 MCG/5ML IJ SOLN
INTRAMUSCULAR | Status: AC
  Filled 2022-05-27: qty 5

## 2022-05-27 MED ORDER — MELATONIN 5 MG PO TABS: 5.0000 mg | ORAL_TABLET | Freq: Every evening | ORAL | Status: DC | PRN

## 2022-05-27 MED ORDER — MIDAZOLAM HCL 2 MG/2ML IJ SOLN
INTRAMUSCULAR | Status: AC
  Filled 2022-05-27: qty 2

## 2022-05-27 MED ORDER — HYDROMORPHONE HCL 1 MG/ML IJ SOLN
0.5000 mg | INTRAMUSCULAR | Status: DC | PRN
  Administered 2022-05-28 (×2): 0.5 mg via INTRAVENOUS
  Filled 2022-05-27 (×2): qty 0.5

## 2022-05-27 MED ORDER — ACETAMINOPHEN 325 MG PO TABS: 650.0000 mg | ORAL_TABLET | Freq: Four times a day (QID) | ORAL | Status: DC | PRN

## 2022-05-27 MED ORDER — CEFAZOLIN SODIUM 1 G IJ SOLR
INTRAMUSCULAR | Status: AC
  Filled 2022-05-27: qty 20

## 2022-05-27 MED ORDER — CEFAZOLIN SODIUM-DEXTROSE 2-3 GM-%(50ML) IV SOLR
INTRAVENOUS | Status: DC | PRN
  Administered 2022-05-27: 2 g via INTRAVENOUS

## 2022-05-27 MED ORDER — SENNOSIDES-DOCUSATE SODIUM 8.6-50 MG PO TABS
1.0000 | ORAL_TABLET | Freq: Every day | ORAL | Status: DC
  Administered 2022-05-28: 1 via ORAL
  Filled 2022-05-27 (×2): qty 1

## 2022-05-27 MED ORDER — OXYCODONE HCL 5 MG PO TABS
5.0000 mg | ORAL_TABLET | Freq: Four times a day (QID) | ORAL | Status: DC | PRN
  Administered 2022-05-28: 5 mg via ORAL
  Filled 2022-05-27: qty 1

## 2022-05-27 MED ORDER — POTASSIUM CHLORIDE CRYS ER 20 MEQ PO TBCR
40.0000 meq | EXTENDED_RELEASE_TABLET | Freq: Once | ORAL | Status: AC
  Administered 2022-05-28: 40 meq via ORAL
  Filled 2022-05-27: qty 2

## 2022-05-27 MED ORDER — EPHEDRINE SULFATE (PRESSORS) 50 MG/ML IJ SOLN
INTRAMUSCULAR | Status: DC | PRN
  Administered 2022-05-27: 5 mg via INTRAVENOUS

## 2022-05-27 MED ORDER — DEXAMETHASONE SODIUM PHOSPHATE 10 MG/ML IJ SOLN
INTRAMUSCULAR | Status: AC
  Filled 2022-05-27: qty 1

## 2022-05-27 MED ORDER — AMISULPRIDE (ANTIEMETIC) 5 MG/2ML IV SOLN: 10.0000 mg | Freq: Once | INTRAVENOUS | Status: DC | PRN

## 2022-05-27 MED ORDER — LIDOCAINE HCL (CARDIAC) PF 100 MG/5ML IV SOSY
PREFILLED_SYRINGE | INTRAVENOUS | Status: DC | PRN
  Administered 2022-05-27: 60 mg via INTRAVENOUS

## 2022-05-27 SURGICAL SUPPLY — 37 items
BLADE LONG MED 31X9 (MISCELLANEOUS) ×1 IMPLANT
BNDG CMPR 9X4 STRL LF SNTH (GAUZE/BANDAGES/DRESSINGS) ×1
BNDG COHESIVE 1X5 TAN STRL LF (GAUZE/BANDAGES/DRESSINGS) ×1 IMPLANT
BNDG ESMARK 4X9 LF (GAUZE/BANDAGES/DRESSINGS) ×1 IMPLANT
BNDG GAUZE DERMACEA FLUFF 4 (GAUZE/BANDAGES/DRESSINGS) IMPLANT
BNDG GZE DERMACEA 4 6PLY (GAUZE/BANDAGES/DRESSINGS)
CNTNR URN SCR LID CUP LEK RST (MISCELLANEOUS) IMPLANT
CONT SPEC 4OZ STRL OR WHT (MISCELLANEOUS) ×1
CORD BIPOLAR FORCEPS 12FT (ELECTRODE) ×1 IMPLANT
CUFF TOURN SGL QUICK 18X4 (TOURNIQUET CUFF) ×1 IMPLANT
CUFF TOURN SGL QUICK 24 (TOURNIQUET CUFF)
CUFF TRNQT CYL 24X4X16.5-23 (TOURNIQUET CUFF) IMPLANT
DRAPE INCISE IOBAN 66X45 STRL (DRAPES) IMPLANT
DRSG EMULSION OIL 3X3 NADH (GAUZE/BANDAGES/DRESSINGS) IMPLANT
GAUZE SPONGE 4X4 12PLY STRL (GAUZE/BANDAGES/DRESSINGS) IMPLANT
GAUZE XEROFORM 1X8 LF (GAUZE/BANDAGES/DRESSINGS) ×1 IMPLANT
GLOVE BIO SURGEON STRL SZ7.5 (GLOVE) ×1 IMPLANT
GLOVE BIOGEL PI IND STRL 8 (GLOVE) ×1 IMPLANT
GOWN STRL REUS W/ TWL LRG LVL3 (GOWN DISPOSABLE) ×1 IMPLANT
GOWN STRL REUS W/TWL LRG LVL3 (GOWN DISPOSABLE) ×2
KIT BASIN OR (CUSTOM PROCEDURE TRAY) ×1 IMPLANT
KIT TURNOVER KIT B (KITS) ×1 IMPLANT
NS IRRIG 1000ML POUR BTL (IV SOLUTION) ×1 IMPLANT
PACK ORTHO EXTREMITY (CUSTOM PROCEDURE TRAY) ×1 IMPLANT
PAD ARMBOARD 7.5X6 YLW CONV (MISCELLANEOUS) ×2 IMPLANT
PAD CAST 4YDX4 CTTN HI CHSV (CAST SUPPLIES) IMPLANT
PADDING CAST COTTON 4X4 STRL (CAST SUPPLIES)
SET CYSTO W/LG BORE CLAMP LF (SET/KITS/TRAYS/PACK) IMPLANT
SUT CHROMIC 6 0 PS 4 (SUTURE) IMPLANT
SUT ETHILON 3 0 PS 1 (SUTURE) IMPLANT
SUT MON AB 5-0 PS2 18 (SUTURE) IMPLANT
SUT VICRYL 4-0 PS2 18IN ABS (SUTURE) IMPLANT
SWAB COLLECTION DEVICE MRSA (MISCELLANEOUS) IMPLANT
SWAB CULTURE ESWAB REG 1ML (MISCELLANEOUS) IMPLANT
SYR CONTROL 10ML LL (SYRINGE) IMPLANT
TOWEL GREEN STERILE (TOWEL DISPOSABLE) ×1 IMPLANT
UNDERPAD 30X36 HEAVY ABSORB (UNDERPADS AND DIAPERS) ×1 IMPLANT

## 2022-05-27 NOTE — Anesthesia Preprocedure Evaluation (Signed)
Anesthesia Evaluation  Patient identified by MRN, date of birth, ID band Patient awake    Reviewed: Allergy & Precautions, NPO status , Patient's Chart, lab work & pertinent test results  Airway Mallampati: II  TM Distance: >3 FB Neck ROM: Full    Dental  (+) Dental Advisory Given   Pulmonary Current Smoker   breath sounds clear to auscultation       Cardiovascular negative cardio ROS  Rhythm:Regular Rate:Normal     Neuro/Psych negative neurological ROS     GI/Hepatic negative GI ROS,,,(+)     substance abuse    Endo/Other  negative endocrine ROS    Renal/GU negative Renal ROS     Musculoskeletal   Abdominal   Peds  Hematology negative hematology ROS (+)   Anesthesia Other Findings   Reproductive/Obstetrics                             Anesthesia Physical Anesthesia Plan  ASA: 2 and emergent  Anesthesia Plan: General   Post-op Pain Management: Toradol IV (intra-op)* and Precedex   Induction: Intravenous  PONV Risk Score and Plan: 1 and Dexamethasone, Ondansetron and Treatment may vary due to age or medical condition  Airway Management Planned: Oral ETT  Additional Equipment: None  Intra-op Plan:   Post-operative Plan: Extubation in OR  Informed Consent: I have reviewed the patients History and Physical, chart, labs and discussed the procedure including the risks, benefits and alternatives for the proposed anesthesia with the patient or authorized representative who has indicated his/her understanding and acceptance.     Dental advisory given  Plan Discussed with:   Anesthesia Plan Comments:        Anesthesia Quick Evaluation

## 2022-05-27 NOTE — ED Triage Notes (Signed)
Pt c/o infection of left second digit of handx2-3wks. The tip of pt's finger is necrotic and erythematous. Pt has 2+ swelling of finger.

## 2022-05-27 NOTE — Anesthesia Procedure Notes (Signed)
Procedure Name: Intubation Date/Time: 05/27/2022 11:06 PM  Performed by: Bertie Simien T, CRNAPre-anesthesia Checklist: Patient identified, Emergency Drugs available, Suction available and Patient being monitored Patient Re-evaluated:Patient Re-evaluated prior to induction Oxygen Delivery Method: Circle system utilized Preoxygenation: Pre-oxygenation with 100% oxygen Induction Type: IV induction, Rapid sequence and Cricoid Pressure applied Ventilation: Mask ventilation without difficulty Laryngoscope Size: Mac Grade View: Grade I Tube type: Oral Tube size: 7.5 mm Number of attempts: 1 Airway Equipment and Method: Stylet and Oral airway Placement Confirmation: ETT inserted through vocal cords under direct vision, positive ETCO2 and breath sounds checked- equal and bilateral Secured at: 23 cm Tube secured with: Tape Dental Injury: Teeth and Oropharynx as per pre-operative assessment

## 2022-05-27 NOTE — ED Notes (Signed)
Report called to OR RN. Patient going to short stay 36 at this time

## 2022-05-27 NOTE — ED Notes (Signed)
Dr. Ala Bent (hand surgeon) called and is aware pt is here and of pt's status.

## 2022-05-27 NOTE — ED Provider Triage Note (Signed)
Emergency Medicine Provider Triage Evaluation Note  TEIGAN SAHLI , a 27 y.o. male  was evaluated in triage.  Pt complains of R index finger infection.  States hand doctor Dr. Ala Bent is going to do a surgery tonight on his hand.  No fever.  Review of Systems  Positive: As above Negative: As above  Physical Exam  BP 123/79 (BP Location: Left Arm)   Pulse (!) 58   Temp 98 F (36.7 C) (Oral)   Resp 18   Ht 5' 11.25" (1.81 m)   Wt 100.2 kg   SpO2 98%   BMI 30.59 kg/m  Gen:   Awake, no distress   Resp:  Normal effort  MSK:   Moves extremities without difficulty  Other:    Medical Decision Making  Medically screening exam initiated at 6:39 PM.  Appropriate orders placed.  Kiam LUCAS WINOGRAD was informed that the remainder of the evaluation will be completed by another provider, this initial triage assessment does not replace that evaluation, and the importance of remaining in the ED until their evaluation is complete.     Rex Kras, [place] 05/27/22 [phone]

## 2022-05-27 NOTE — ED Provider Notes (Signed)
[facility] Provider Note   CSN: [id number] Arrival date & time: 05/27/22  1726     History  Chief Complaint  Patient presents with   finger infection    William Rollins is a 27 y.o. male.  He is a 27 year old male presents with right index finger infection.  Seen by orthopedics today and found to have osteomyelitis on x-ray.  Sent here to be admitted by medicine for treatment of osteomyelitis.  Patient is also going to need to go to OR for treatment.  Denies any fever or chills       Home Medications Prior to Admission medications   Medication Sig Start Date End Date Taking? Authorizing Provider  cephALEXin (KEFLEX) 500 MG capsule Take 1 capsule (500 mg total) by mouth 4 (four) times daily. 05/17/22   Brunetta Jeans, PA-C      Allergies    Patient has no known allergies.    Review of Systems   Review of Systems  All other systems reviewed and are negative.   Physical Exam Updated Vital Signs BP 123/79 (BP Location: Left Arm)   Pulse (!) 58   Temp 98 F (36.7 C) (Oral)   Resp 18   Ht 1.81 m (5' 11.25")   Wt 100.2 kg   SpO2 98%   BMI 30.59 kg/m  Physical Exam Vitals and nursing note reviewed.  Constitutional:      General: He is not in acute distress.    Appearance: Normal appearance. He is well-developed. He is not toxic-appearing.  HENT:     Head: Normocephalic and atraumatic.  Eyes:     General: Lids are normal.     Conjunctiva/sclera: Conjunctivae normal.     Pupils: Pupils are equal, round, and reactive to light.  Neck:     Thyroid: No thyroid mass.     Trachea: No tracheal deviation.  Cardiovascular:     Rate and Rhythm: Normal rate and regular rhythm.     Heart sounds: Normal heart sounds. No murmur heard.    No gallop.  Pulmonary:     Effort: Pulmonary effort is normal. No respiratory distress.     Breath sounds: Normal breath sounds. No stridor. No decreased breath sounds, wheezing, rhonchi or  rales.  Abdominal:     General: There is no distension.     Palpations: Abdomen is soft.     Tenderness: There is no abdominal tenderness. There is no rebound.  Musculoskeletal:        General: No tenderness. Normal range of motion.     Cervical back: Normal range of motion and neck supple.     Comments: Necrotic material noted to the distal tip of right index finger.  Skin:    General: Skin is warm and dry.     Findings: No abrasion or rash.  Neurological:     Mental Status: He is alert and oriented to person, place, and time. Mental status is at baseline.     GCS: GCS eye subscore is 4. GCS verbal subscore is 5. GCS motor subscore is 6.     Cranial Nerves: No cranial nerve deficit.     Sensory: No sensory deficit.     Motor: Motor function is intact.  Psychiatric:        Attention and Perception: Attention normal.        Speech: Speech normal.        Behavior: Behavior normal.     ED  Results / Procedures / Treatments   Labs (all labs ordered are listed, but only abnormal results are displayed) Labs Reviewed  CBC - Abnormal; Notable for the following components:      Result Value   WBC 13.8 (*)    All other components within normal limits  BASIC METABOLIC PANEL - Abnormal; Notable for the following components:   Potassium 3.4 (*)    All other components within normal limits    EKG None  Radiology No results found.  Procedures Procedures    Medications Ordered in ED Medications - No data to display  ED Course/ Medical Decision Making/ A&P                             Medical Decision Making  Discussed with hand surgeon on-call and he request patient to be admitted to medicine service.  He will take the patient to the OR tonight.  He is no antibiotics started until after the patient has surgery        Final Clinical Impression(s) / ED Diagnoses Final diagnoses:  None    Rx / DC Orders ED Discharge Orders     None         Lacretia Leigh,  MD 05/27/22 2009

## 2022-05-27 NOTE — H&P (Addendum)
History and Physical  MERLAND HOLNESS FUX:[id number] DOB: 1995/07/17 DOA: 05/27/2022  Referring physician: Dr. Zenia Resides, [place] PCP: Patient, No Pcp Per  Outpatient Specialists: Orthopedic surgery. Patient coming from: Home.  Chief Complaint: Left index finger osteomyelitis.  HPI: William Rollins is a 27 y.o. male with medical history significant for former cocaine and THC abuse in 2018, left index finger infection, started on 05/06/2022 after he popped a blood blister from his finger.  The patient does manual work for a living.  Has popped the blistered area a few times and noted some purulent drainage.  Denies subjective fevers or chills.    He was initially started on antibiotics outpatient, Keflex 4 times daily, 500 mg for 7 days, after being evaluated at the urgent care on 05/06/2022.  The course of antibiotics was extended on 05/17/2022 for 5 more days by his PCP.    He went to see his hand surgeon in the office today.  Imaging revealed findings of suspected left index finger osteomyelitis on xray.  Recommendation was made to come to the ED for amputation of his left index finger.  Will hold off antibiotics until after biopsies are obtained by hand surgery.  In the ED, vital signs are stable.  Afebrile.  No significant pain reported.  His father is present at the bedside.  ED Course: Tmax 98.2.  BP 123/79, pulse 58, respiratory 18, O2 saturation 98% on room air.  Lab studies remarkable for WBC 13.8.  Potassium 3.4.  T. bili Ruben 1.6.  Review of Systems: Review of systems as noted in the HPI. All other systems reviewed and are negative.   History reviewed. No pertinent past medical history. History reviewed. No pertinent surgical history.  Social History:  reports that he has been smoking cigarettes. He has been smoking an average of .5 packs per day. He has never used smokeless tobacco. He reports current alcohol use of about 7.0 standard drinks of alcohol per week. He reports current drug  use. Drug: Marijuana.   No Known Allergies  Family History  Problem Relation Age of Onset   Hypertension Father       Prior to Admission medications   Medication Sig Start Date End Date Taking? Authorizing Provider  cephALEXin (KEFLEX) 500 MG capsule Take 1 capsule (500 mg total) by mouth 4 (four) times daily. 05/17/22   Brunetta Jeans, PA-C    Physical Exam: BP 123/79 (BP Location: Left Arm)   Pulse (!) 58   Temp 98 F (36.7 C) (Oral)   Resp 18   Ht 5' 11.25" (1.81 m)   Wt 100.2 kg   SpO2 98%   BMI 30.59 kg/m   General: 27 y.o. year-old male well developed well nourished in no acute distress.  Alert and oriented x3. Cardiovascular: Regular rate and rhythm with no rubs or gallops.  No thyromegaly or JVD noted.  No lower extremity edema. 2/4 pulses in all 4 extremities. Respiratory: Clear to auscultation with no wheezes or rales. Good inspiratory effort. Abdomen: Soft nontender nondistended with normal bowel sounds x4 quadrants. Muskuloskeletal: No cyanosis, clubbing or edema noted bilaterally Neuro: CN II-XII intact, strength, sensation, reflexes Skin: Tip of left index finger with necrosis. Psychiatry: Judgement and insight appear normal. Mood is appropriate for condition and setting          Labs on Admission:  Basic Metabolic Panel: Recent Labs  Lab 05/27/22 1840  NA 137  K 3.4*  CL 104  CO2 22  GLUCOSE 98  BUN 17  CREATININE 0.77  CALCIUM 9.6   Liver Function Tests: No results for input(s): "AST", "ALT", "ALKPHOS", "BILITOT", "PROT", "ALBUMIN" in the last 168 hours. No results for input(s): "LIPASE", "AMYLASE" in the last 168 hours. No results for input(s): "AMMONIA" in the last 168 hours. CBC: Recent Labs  Lab 05/27/22 1840  WBC 13.8*  HGB 14.8  HCT 43.8  MCV 86.9  PLT 287   Cardiac Enzymes: No results for input(s): "CKTOTAL", "CKMB", "CKMBINDEX", "TROPONINI" in the last 168 hours.  BNP (last 3 results) No results for input(s): "BNP" in  the last 8760 hours.  ProBNP (last 3 results) No results for input(s): "PROBNP" in the last 8760 hours.  CBG: No results for input(s): "GLUCAP" in the last 168 hours.  Radiological Exams on Admission: No results found.  EKG: I independently viewed the EKG done and my findings are as followed: None available at the time of this visit.  Assessment/Plan Present on Admission:  Osteomyelitis ([place])  Principal Problem:   Osteomyelitis ([place])  Left index finger osteomyelitis, POA Obtain MRSA screening test Obtain baseline CRP and sed rate levels Peripheral blood cultures x 2 Hold off antibiotics until after biopsies are obtained by hand surgery. As needed analgesics and bowel regimen WBC 13.8 on presentation, monitor fever curve and WBC LR at 75 cc/h x 1 day Hand surgery consulted by EDP.  Plan to take to the OR tonight. Start antibiotics after surgery  Leukocytosis secondary to above Monitor fever curve and WBC Follow blood cultures and bone/tissue culture  Hypokalemia Potassium 3.4 Repleted orally.  Elevated T bilirubin T. bili 1.6 Monitor for now Repeat CMP in the morning  Obesity BMI 30 Recommend weight loss outpatient regular physical activity and healthy dieting.   DVT prophylaxis: SCDs  Code Status: Full code  Family Communication: Updated his father at bedside.  Disposition Plan: Admit to MedSurg unit  Consults called: Hand surgery  Admission status: Observation status   Status is: Observation    Kayleen Memos MD [facility] Pager [phone]  If 7PM-7AM, please contact night-coverage [email or web address] Password [facility]  05/27/2022, 8:39 PM

## 2022-05-27 NOTE — H&P (Signed)
Orthopaedic Surgery Hand and Upper Extremity History and Physical Examination 05/27/2022  Referring Provider: No referring provider defined for this encounter.  CC: Infected left index finger  HPI: William Rollins is a 27 y.o. male was seen in my office earlier today and found to have a chronically infected left index finger.  Approximately 3 weeks ago he developed a significant blood blister on his left index finger that popped it and it got infected.  I sent him to the hospital for surgery and admission for IV antibiotics.   Past Medical History: History reviewed. No pertinent past medical history.   Medications: Scheduled Meds:  potassium chloride  40 mEq Oral Once   senna-docusate  1 tablet Oral QHS   Continuous Infusions:  lactated ringers     PRN Meds:.acetaminophen, HYDROmorphone (DILAUDID) injection, melatonin, ondansetron (ZOFRAN) IV, oxyCODONE, polyethylene glycol  Allergies: Allergies as of 05/27/2022   (No Known Allergies)    Past Surgical History: History reviewed. No pertinent surgical history.   Social History: Social History   Occupational History   Not on file  Tobacco Use   Smoking status: Every Day    Packs/day: 0.50    Types: Cigarettes   Smokeless tobacco: Never  Substance and Sexual Activity   Alcohol use: Yes    Alcohol/week: 7.0 standard drinks of alcohol    Types: 7 Cans of beer per week   Drug use: Yes    Types: Marijuana   Sexual activity: Not on file     Family History: Family History  Problem Relation Age of Onset   Hypertension Father    Otherwise, no relevant orthopaedic family history  ROS: Review of Systems: All systems reviewed and are negative except that mentioned in HPI  Work/Sport/Hobbies: See HPI  Physical Examination: Vitals:   05/27/22 1744 05/27/22 1828  BP: (!) 134/56 123/79  Pulse: 66 (!) 58  Resp: 18 18  Temp: 98.2 F (36.8 C) 98 F (36.7 C)  SpO2: 96% 98%   Constitutional: Awake, alert.   WN/WD Appearance: healthy, no acute distress, well-groomed Affect: Normal HEENT: EOMI, mucous membranes moist CV: RRR Pulm: breathing comfortably  Left upper Extremity / Hand Cellulitis and necrosis of the left index fingertip.  There is a large eschar.  The area is tender.  DIP range of motion approximately 20 degrees of flexion, PIP flexion from 0-90, MCP flexion from +10-100.  He does not have sensation at the distal tip but otherwise has intact sensation to light touch in median, radial, and ulnar nerve distributions.  Motor is intact in AIN, PIN, ulnar nerve distributions.  Fingers are warm and well-perfused except for the left index finger and he has a palpable radial pulse.  Pertinent Labs: n/a  Imaging: I have personally reviewed the following studies: Images obtained to my office earlier today were concerning for osteomyelitis of the distal phalanx of the left index finger.  Additional Studies: n/a  Assessment/Plan: Osteomyelitis, unspecified site, unspecified type [facility])  Discussed with the patient the risk of worsening infection with nonoperative treatment.  Specifically the osteomyelitis could cause worsening infection and spread to the entire body causing sepsis.  We discussed the risks benefits and alternatives of surgery and we will proceed with surgery at this time.  Surgery will be in the form of excisional debridement, bone biopsy and cultures as well as possible partial amputation of the left index finger.  Wadie Lessen, MD Hand and Upper Extremity Surgery [facility] 05/27/2022 10:21 PM

## 2022-05-28 ENCOUNTER — Other Ambulatory Visit: Payer: Self-pay

## 2022-05-28 ENCOUNTER — Encounter (HOSPITAL_COMMUNITY): Payer: Self-pay | Admitting: Orthopedic Surgery

## 2022-05-28 DIAGNOSIS — E669 Obesity, unspecified: Secondary | ICD-10-CM | POA: Diagnosis not present

## 2022-05-28 DIAGNOSIS — Z8249 Family history of ischemic heart disease and other diseases of the circulatory system: Secondary | ICD-10-CM | POA: Diagnosis not present

## 2022-05-28 DIAGNOSIS — M19042 Primary osteoarthritis, left hand: Secondary | ICD-10-CM

## 2022-05-28 DIAGNOSIS — F17210 Nicotine dependence, cigarettes, uncomplicated: Secondary | ICD-10-CM | POA: Diagnosis not present

## 2022-05-28 DIAGNOSIS — E876 Hypokalemia: Secondary | ICD-10-CM | POA: Diagnosis not present

## 2022-05-28 DIAGNOSIS — I96 Gangrene, not elsewhere classified: Secondary | ICD-10-CM | POA: Diagnosis not present

## 2022-05-28 DIAGNOSIS — M869 Osteomyelitis, unspecified: Secondary | ICD-10-CM | POA: Diagnosis not present

## 2022-05-28 DIAGNOSIS — L089 Local infection of the skin and subcutaneous tissue, unspecified: Secondary | ICD-10-CM | POA: Diagnosis not present

## 2022-05-28 DIAGNOSIS — D72828 Other elevated white blood cell count: Secondary | ICD-10-CM | POA: Diagnosis not present

## 2022-05-28 DIAGNOSIS — Z6830 Body mass index (BMI) 30.0-30.9, adult: Secondary | ICD-10-CM | POA: Diagnosis not present

## 2022-05-28 DIAGNOSIS — B9561 Methicillin susceptible Staphylococcus aureus infection as the cause of diseases classified elsewhere: Secondary | ICD-10-CM | POA: Diagnosis not present

## 2022-05-28 DIAGNOSIS — L03012 Cellulitis of left finger: Secondary | ICD-10-CM | POA: Diagnosis not present

## 2022-05-28 LAB — COMPREHENSIVE METABOLIC PANEL
ALT: 28 U/L (ref 0–44)
AST: 20 U/L (ref 15–41)
Albumin: 3.8 g/dL (ref 3.5–5.0)
Alkaline Phosphatase: 55 U/L (ref 38–126)
Anion gap: 7 (ref 5–15)
BUN: 11 mg/dL (ref 6–20)
CO2: 24 mmol/L (ref 22–32)
Calcium: 8.9 mg/dL (ref 8.9–10.3)
Chloride: 107 mmol/L (ref 98–111)
Creatinine, Ser: 0.82 mg/dL (ref 0.61–1.24)
GFR, Estimated: >60 mL/min (ref >60)
Glucose, Bld: 95 mg/dL (ref 70–99)
Potassium: 3.7 mmol/L (ref 3.5–5.1)
Sodium: 138 mmol/L (ref 135–145)
Total Bilirubin: 1.6 mg/dL — ABNORMAL HIGH (ref 0.3–1.2)
Total Protein: 6.4 g/dL — ABNORMAL LOW (ref 6.5–8.1)

## 2022-05-28 LAB — CULTURE, BLOOD (ROUTINE X 2): Culture: NO GROWTH

## 2022-05-28 LAB — C-REACTIVE PROTEIN: CRP: <0.5 mg/dL (ref <1.0)

## 2022-05-28 LAB — PHOSPHORUS: Phosphorus: 3.5 mg/dL (ref 2.5–4.6)

## 2022-05-28 LAB — CBC
HCT: 39.1 % (ref 39.0–52.0)
Hemoglobin: 14 g/dL (ref 13.0–17.0)
MCH: 29.7 pg (ref 26.0–34.0)
MCHC: 35.8 g/dL (ref 30.0–36.0)
MCV: 83 fL (ref 80.0–100.0)
Platelets: 219 10*3/uL (ref 150–400)
RBC: 4.71 MIL/uL (ref 4.22–5.81)
RDW: 11.9 % (ref 11.5–15.5)
WBC: 12.1 10*3/uL — ABNORMAL HIGH (ref 4.0–10.5)
nRBC: 0 % (ref 0.0–0.2)

## 2022-05-28 LAB — SEDIMENTATION RATE: Sed Rate: 2 mm/hr (ref 0–16)

## 2022-05-28 LAB — MAGNESIUM: Magnesium: 1.7 mg/dL (ref 1.7–2.4)

## 2022-05-28 MED ORDER — METRONIDAZOLE 500 MG/100ML IV SOLN
500.0000 mg | Freq: Two times a day (BID) | INTRAVENOUS | Status: DC
  Administered 2022-05-28 – 2022-05-30 (×5): 500 mg via INTRAVENOUS
  Filled 2022-05-28 (×5): qty 100

## 2022-05-28 MED ORDER — SODIUM CHLORIDE 0.9 % IV SOLN
2.0000 g | Freq: Three times a day (TID) | INTRAVENOUS | Status: DC
  Administered 2022-05-28 – 2022-05-30 (×8): 2 g via INTRAVENOUS
  Filled 2022-05-28 (×8): qty 12.5

## 2022-05-28 MED ORDER — PROPOFOL 10 MG/ML IV BOLUS
INTRAVENOUS | Status: AC
  Filled 2022-05-28: qty 20

## 2022-05-28 MED ORDER — ONDANSETRON HCL 4 MG/2ML IJ SOLN
INTRAMUSCULAR | Status: DC | PRN
  Administered 2022-05-28: 4 mg via INTRAVENOUS

## 2022-05-28 MED ORDER — VANCOMYCIN HCL IN DEXTROSE 1-5 GM/200ML-% IV SOLN
1000.0000 mg | Freq: Three times a day (TID) | INTRAVENOUS | Status: DC
  Administered 2022-05-28 – 2022-05-30 (×7): 1000 mg via INTRAVENOUS
  Filled 2022-05-28 (×8): qty 200

## 2022-05-28 MED ORDER — FENTANYL CITRATE (PF) 100 MCG/2ML IJ SOLN
INTRAMUSCULAR | Status: AC
  Filled 2022-05-28: qty 2

## 2022-05-28 NOTE — Progress Notes (Signed)
PROGRESS NOTE    William Rollins  KXF:[id number] DOB: 1995-05-25 DOA: 05/27/2022 PCP: Patient, No Pcp Per     Brief Narrative:  William Rollins is a 27 y.o. male with medical history significant for former cocaine and THC abuse in 2018, left index finger infection, started on 05/06/2022 after he popped a blood blister from his finger.  The patient does manual work for a living. Has popped the blistered area a few times and noted some purulent drainage. Denies subjective fevers or chills.   He was initially started on antibiotics outpatient, Keflex 4 times daily, 500 mg for 7 days, after being evaluated at the urgent care on 05/06/2022. The course of antibiotics was extended on 05/17/2022 for 5 more days by his PCP. He went to see his hand surgeon in the office today.  Imaging revealed findings of suspected left index finger osteomyelitis on xray.    New events last 24 hours / Subjective: Patient resting in bed, denies any new complaints today.  Has been afebrile.  Assessment & Plan:   Principal Problem:   Osteoarthritis of finger, left  Left index finger osteomyelitis -Status post debridement 1/26 -Biopsies and cultures are pending -Continue vancomycin, cefepime and Flagyl -Leukocytosis improving -Monitor CBC, CRP -Nonweightbearing left upper extremity    DVT prophylaxis:  SCDs Start: 05/27/22 2051  Code Status: Full Family Communication: Dad and Mom at bedside Disposition Plan:  Status is: Observation The patient will require care spanning > 2 midnights and should be moved to inpatient because: IV antibiotics    Antimicrobials:  Anti-infectives (From admission, onward)    Start     Dose/Rate Route Frequency Ordered Stop   05/28/22 0800  metroNIDAZOLE (FLAGYL) IVPB 500 mg        500 mg 100 mL/hr over 60 Minutes Intravenous Every 12 hours 05/28/22 0617     05/28/22 0715  vancomycin (VANCOCIN) IVPB 1000 mg/200 mL premix        1,000 mg 200 mL/hr over 60 Minutes Intravenous  Every 8 hours 05/28/22 0626     05/28/22 0630  ceFEPIme (MAXIPIME) 2 g in sodium chloride 0.9 % 100 mL IVPB        2 g 200 mL/hr over 30 Minutes Intravenous Every 8 hours 05/28/22 0620          Objective: Vitals:   05/28/22 0115 05/28/22 0126 05/28/22 0536 05/28/22 0810  BP: 112/71 135/68 130/65 118/71  Pulse: 66 65 63 60  Resp: 13 16 18 17   Temp: 98 F (36.7 C) 98.1 F (36.7 C) 98.2 F (36.8 C) 97.7 F (36.5 C)  TempSrc:  Oral Oral Oral  SpO2: 93% 97% 98% 99%  Weight:      Height:        Intake/Output Summary (Last 24 hours) at 05/28/2022 1256 Last data filed at 05/28/2022 0020 Gross per 24 hour  Intake 1500 ml  Output --  Net 1500 ml   Filed Weights   05/27/22 1830  Weight: 100.2 kg    Examination:  General exam: Appears calm and comfortable  Respiratory system: Clear to auscultation. Respiratory effort normal. No respiratory distress. No conversational dyspnea.  Cardiovascular system: S1 & S2 heard, RRR. No murmurs. No pedal edema. Gastrointestinal system: Abdomen is nondistended, soft and nontender. Normal bowel sounds heard. Central nervous system: Alert and oriented. No focal neurological deficits. Speech clear.  Extremities: left index finger in dry and clean dressing  Psychiatry: Judgement and insight appear normal. Mood & affect appropriate.  Data Reviewed: I have personally reviewed following labs and imaging studies  CBC: Recent Labs  Lab 05/27/22 1840 05/28/22 0619  WBC 13.8* 12.1*  HGB 14.8 14.0  HCT 43.8 39.1  MCV 86.9 83.0  PLT 287 956   Basic Metabolic Panel: Recent Labs  Lab 05/27/22 1840 05/28/22 0619  NA 137 138  K 3.4* 3.7  CL 104 107  CO2 22 24  GLUCOSE 98 95  BUN 17 11  CREATININE 0.77 0.82  CALCIUM 9.6 8.9  MG  --  1.7  PHOS  --  3.5   GFR: Estimated Creatinine Clearance: 166.7 mL/min (by C-G formula based on SCr of 0.82 mg/dL). Liver Function Tests: Recent Labs  Lab 05/28/22 0619  AST 20  ALT 28  ALKPHOS 55   BILITOT 1.6*  PROT 6.4*  ALBUMIN 3.8   No results for input(s): "LIPASE", "AMYLASE" in the last 168 hours. No results for input(s): "AMMONIA" in the last 168 hours. Coagulation Profile: No results for input(s): "INR", "PROTIME" in the last 168 hours. Cardiac Enzymes: No results for input(s): "CKTOTAL", "CKMB", "CKMBINDEX", "TROPONINI" in the last 168 hours. BNP (last 3 results) No results for input(s): "PROBNP" in the last 8760 hours. HbA1C: No results for input(s): "HGBA1C" in the last 72 hours. CBG: No results for input(s): "GLUCAP" in the last 168 hours. Lipid Profile: No results for input(s): "CHOL", "HDL", "LDLCALC", "TRIG", "CHOLHDL", "LDLDIRECT" in the last 72 hours. Thyroid Function Tests: No results for input(s): "TSH", "T4TOTAL", "FREET4", "T3FREE", "THYROIDAB" in the last 72 hours. Anemia Panel: No results for input(s): "VITAMINB12", "FOLATE", "FERRITIN", "TIBC", "IRON", "RETICCTPCT" in the last 72 hours. Sepsis Labs: No results for input(s): "PROCALCITON", "LATICACIDVEN" in the last 168 hours.  Recent Results (from the past 240 hour(s))  Culture, blood (Routine X 2) w Reflex to ID Panel     Status: None (Preliminary result)   Collection Time: 05/27/22  8:50 PM   Specimen: BLOOD  Result Value Ref Range Status   Specimen Description BLOOD SITE NOT SPECIFIED  Final   Special Requests   Final    BOTTLES DRAWN AEROBIC AND ANAEROBIC Blood Culture results may not be optimal due to an excessive volume of blood received in culture bottles   Culture   Final    NO GROWTH < 12 HOURS Performed at [facility] [address] [place], [place] [address]    Report Status PENDING  Incomplete  Culture, blood (Routine X 2) w Reflex to ID Panel     Status: None (Preliminary result)   Collection Time: 05/27/22  8:55 PM   Specimen: BLOOD  Result Value Ref Range Status   Specimen Description BLOOD SITE NOT SPECIFIED  Final   Special Requests   Final    BOTTLES DRAWN AEROBIC  AND ANAEROBIC Blood Culture adequate volume   Culture   Final    NO GROWTH < 12 HOURS Performed at [facility], [place] [address], [place], [place] [address]    Report Status PENDING  Incomplete  Aerobic/Anaerobic Culture w Gram Stain (surgical/deep wound)     Status: None (Preliminary result)   Collection Time: 05/27/22 11:31 PM   Specimen: Finger, Left; Abscess  Result Value Ref Range Status   Specimen Description WOUND  Final   Special Requests LEFT INDEX FINGER TIP ABSC SPEC A  Final   Gram Stain   Final    NO WBC SEEN RARE GRAM POSITIVE COCCI Performed at [facility], [address] [place], [place] [address]    Culture PENDING  Incomplete   Report Status PENDING  Incomplete  Aerobic/Anaerobic Culture w Gram Stain (surgical/deep wound)     Status: None (Preliminary result)   Collection Time: 05/27/22 11:51 PM   Specimen: Bone; Tissue  Result Value Ref Range Status   Specimen Description TISSUE  Final   Special Requests LEFT INDEX FINGER BNE AND TIS DEEP SPEC B  Final   Gram Stain   Final    NO WBC SEEN NO ORGANISMS SEEN Performed at [facility], [address] [place], [place] [address]    Culture PENDING  Incomplete   Report Status PENDING  Incomplete      Radiology Studies: No results found.    Scheduled Meds:  fentaNYL       senna-docusate  1 tablet Oral QHS   Continuous Infusions:  ceFEPime (MAXIPIME) IV 2 g (05/28/22 0657)   metronidazole 500 mg (05/28/22 1008)   vancomycin 1,000 mg (05/28/22 0837)     LOS: 0 days   Time spent: 25 minutes   Noralee Stain, DO [facility] 05/28/2022, 12:56 PM   Available via Epic secure chat 7am-7pm After these hours, please refer to coverage provider listed on [email or web address]

## 2022-05-28 NOTE — Transfer of Care (Signed)
Immediate Anesthesia Transfer of Care Note  Patient: William Rollins  Procedure(s) Performed: IRRIGATION AND DEBRIDEMENT LEFT INDEX FINGER WITH BONE BIOPSY AND POSSIBLE AMPUTATION (Hand)  Patient Location: PACU  Anesthesia Type:General  Level of Consciousness: awake, alert , and oriented  Airway & Oxygen Therapy: Patient Spontanous Breathing  Post-op Assessment: Report given to RN, Post -op Vital signs reviewed and stable, and Patient moving all extremities  Post vital signs: Reviewed and stable  Last Vitals:  Vitals Value Taken Time  BP 110/82 05/28/22 0030  Temp 36.7 C 05/28/22 0030  Pulse 76 05/28/22 0041  Resp 14 05/28/22 0041  SpO2 92 % 05/28/22 0041  Vitals shown include unvalidated device data.  Last Pain:  Vitals:   05/28/22 0030  TempSrc:   PainSc: 0-No pain         Complications: No notable events documented.

## 2022-05-28 NOTE — Progress Notes (Signed)
Pharmacy Antibiotic Note  William Rollins is a 27 y.o. male admitted on 05/27/2022 with  left index finger wound infection/osteomyelitis .  Pharmacy has been consulted for Vancomycin/Cefepime dosing.  Plan: Vancomycin 1000 mg IV q8h >>>Estimated AUC: 465 Cefepime 2g IV q8h Flagyl per MD Trend WBC, temp, renal function  F/U infectious work-up Drug levels as indicated   Height: 5' 11.25" (181 cm) Weight: 100.2 kg (220 lb 14.4 oz) IBW/kg (Calculated) : 75.88  Temp (24hrs), Avg:98.1 F (36.7 C), Min:98 F (36.7 C), Max:98.2 F (36.8 C)  Recent Labs  Lab 05/27/22 1840  WBC 13.8*  CREATININE 0.77    Estimated Creatinine Clearance: 170.9 mL/min (by C-G formula based on SCr of 0.77 mg/dL).    No Known Allergies  Narda Bonds, PharmD, BCPS Clinical Pharmacist Phone: [phone]

## 2022-05-28 NOTE — TOC Initial Note (Signed)
Transition of Care [facility]) - Initial/Assessment Note    Patient Details  Name: William Rollins MRN: [id number] Date of Birth: 1995-07-24  Transition of Care [facility]) CM/SW Contact:    Ninfa Meeker, RN Phone Number: 05/28/2022, 1:26 PM  Clinical Narrative:                  Patient is 27 yr old male, has left index finger infection, started on 05/06/2022 after he popped a blood blister from his finger. Failed out patient antibiotic treatment.  Underwent debridement 05/28/22. On IV antibiotics.   Transition of Care [facility]) Department has reviewed patient and no TOC needs have been identified at this time. We will continue to monitor patient advancement through Interdisciplinary progressions and if new patient needs arise, please place a consult.       Patient Goals and CMS Choice            Expected Discharge Plan and Services                                              Prior Living Arrangements/Services                       Activities of Daily Living Home Assistive Devices/Equipment: None ADL Screening (condition at time of admission) Patient's cognitive ability adequate to safely complete daily activities?: Yes Is the patient deaf or have difficulty hearing?: No Does the patient have difficulty seeing, even when wearing glasses/contacts?: No Does the patient have difficulty concentrating, remembering, or making decisions?: No Patient able to express need for assistance with ADLs?: Yes Does the patient have difficulty dressing or bathing?: No Independently performs ADLs?: Yes (appropriate for developmental age) Does the patient have difficulty walking or climbing stairs?: No Weakness of Legs: None Weakness of Arms/Hands: None  Permission Sought/Granted                  Emotional Assessment              Admission diagnosis:  Osteomyelitis ([place]) [M86.9] Osteomyelitis, unspecified site, unspecified type [facility]) [M86.9] Patient Active  Problem List   Diagnosis Date Noted   Osteoarthritis of finger, left 05/27/2022   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

## 2022-05-28 NOTE — Op Note (Signed)
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 Fadel Clason, M.D.   ASSISTANT: none   ANESTHESIA:  General   INTRAVENOUS FLUIDS:  Per anesthesia flow sheet.   ESTIMATED BLOOD LOSS:  Minimal.   COMPLICATIONS:  None.   SPECIMENS: Culture swab x 2 and bone biopsy specimen for cultures to be sent for aerobic, anaerobic, fungal, and AFB   TOURNIQUET TIME:   40 minutes    DISPOSITION:  Stable to PACU.   INDICATIONS: This is a 27 year old right-hand-dominant male who had a chronic infection of the left index finger distal phalanx for the past 3 weeks.  He previously sought medical care and was given oral antibiotics.  Since that time the volar soft tissue of the digit has necrosed with drainage, he has developed surrounding erythema concerning for cellulitis and on imaging obtained today he appeared to have osteomyelitis of the left index distal phalanx.  He had an elevated white count of 13.8.  He requested that we try to not amputate his digit if in any way possible.  OPERATIVE COURSE: Patient was identified in holding.  The site, side, and procedure were confirmed and marked.  Patient was brought back to the operating room and transferred to the OR table in the supine position.  General anesthesia was induced.  A forearm tourniquet was placed on his left forearm which was outstretched on a hand table.  Patient was prescrubbed with chlorhexidine and alcohol and then prepped with Betadine and draped in the typical sterile fashion.  The left upper extremity was elevated to allow for gravity exsanguination for  several minutes and the tourniquet was then inflated.  Antibiotics were held until after specimens were obtained.  Tenotomy's and Adson pickups were used to bluntly dissect and then elevate the necrotic tissue on the volar surface of the fingertip.  Underneath there was some purulence and further necrotic tissue.  This was removed with a rongeur and curette.  There did not seem to be necrotic tissue adjacent to the bone.  The distal tip of bone was exposed and the ronguer was used to take samples for biopsy/culture.  The bone did not look grossly infected therefore the decision was made not to amputate the distal tip.  After cultures were obtained the wound was irrigated with 3 L of normal saline.  The nail plate was removed and placed in a iodine bath.  The healthy appearing skin was able to be reapproximated loosely with slight rearrangement of the fingertip skin.  The nail plate was then loosely sutured into place to stent the eponychial fold open.  Adaptic followed by gauze and Kling wrap were placed over the wound.  Coban was loosely wrapped.  The tourniquet was deflated.  The patient was awake and and taken to PACU in stable condition.  No complications.   Postoperative plan: The patient will keep the dressings on for the next 3 days.  He will remain on IV antibiotics per the medicine team.  Recommend broad-spectrum antibiotics until cultures return.  I would also recommend serial CBCs and every 48 hour CRP labs to monitor the infection.  He should remain nonweightbearing on the left  upper extremity.  As previously discussed with the patient and his family, should the IV antibiotics not allow his infection to resolve, we will need to go back to the operating room for amputation.   Ellard Artis, MD Electronically signed, 05/28/22

## 2022-05-29 DIAGNOSIS — M19042 Primary osteoarthritis, left hand: Secondary | ICD-10-CM | POA: Diagnosis not present

## 2022-05-29 LAB — CBC
HCT: 44.6 % (ref 39.0–52.0)
Hemoglobin: 15.3 g/dL (ref 13.0–17.0)
MCH: 29.5 pg (ref 26.0–34.0)
MCHC: 34.3 g/dL (ref 30.0–36.0)
MCV: 86.1 fL (ref 80.0–100.0)
Platelets: 211 10*3/uL (ref 150–400)
RBC: 5.18 MIL/uL (ref 4.22–5.81)
RDW: 12 % (ref 11.5–15.5)
WBC: 7.9 10*3/uL (ref 4.0–10.5)
nRBC: 0 % (ref 0.0–0.2)

## 2022-05-29 LAB — ACID FAST SMEAR (AFB, MYCOBACTERIA): Acid Fast Smear: NEGATIVE

## 2022-05-29 LAB — C-REACTIVE PROTEIN: CRP: 0.5 mg/dL (ref <1.0)

## 2022-05-29 MED ORDER — ENOXAPARIN SODIUM 40 MG/0.4ML IJ SOSY: 40.0000 mg | PREFILLED_SYRINGE | Freq: Every day | INTRAMUSCULAR | Status: DC

## 2022-05-29 NOTE — Progress Notes (Signed)
PROGRESS NOTE    William Rollins  YIF:[id number] DOB: 07-Mar-1996 DOA: 05/27/2022 PCP: Patient, No Pcp Per     Brief Narrative:  William Rollins is a 27 y.o. male with medical history significant for former cocaine and THC abuse in 2018, left index finger infection, started on 05/06/2022 after he popped a blood blister from his finger.  The patient does manual work for a living. Has popped the blistered area a few times and noted some purulent drainage. Denies subjective fevers or chills.   He was initially started on antibiotics outpatient, Keflex 4 times daily, 500 mg for 7 days, after being evaluated at the urgent care on 05/06/2022. The course of antibiotics was extended on 05/17/2022 for 5 more days by his PCP. He went to see his hand surgeon in the office today.  Imaging revealed findings of suspected left index finger osteomyelitis on xray.  He ultimately underwent debridement 1/26.   New events last 24 hours / Subjective: Doing well without any complaints.  Asking if he can go off unit   Assessment & Plan:   Principal Problem:   Osteoarthritis of finger, left Active Problems:   Finger osteomyelitis, left (HCC)  Left index finger osteomyelitis -Status post debridement 1/26 -Wound culture showing Staph aureus.  Susceptibilities to follow -Continue vancomycin, cefepime and Flagyl -Leukocytosis resolved.  CRP normal -Monitor CBC, CRP -Nonweightbearing left upper extremity    DVT prophylaxis:  enoxaparin (LOVENOX) injection 40 mg Start: 05/29/22 1145 SCDs Start: 05/27/22 2051  Code Status: Full Family Communication: Dad and Mom at bedside Disposition Plan:  Status is: Inpatient Remains inpatient appropriate because: IV antibiotics     Antimicrobials:  Anti-infectives (From admission, onward)    Start     Dose/Rate Route Frequency Ordered Stop   05/28/22 0800  metroNIDAZOLE (FLAGYL) IVPB 500 mg        500 mg 100 mL/hr over 60 Minutes Intravenous Every 12 hours 05/28/22  0617     05/28/22 0715  vancomycin (VANCOCIN) IVPB 1000 mg/200 mL premix        1,000 mg 200 mL/hr over 60 Minutes Intravenous Every 8 hours 05/28/22 0626     05/28/22 0630  ceFEPIme (MAXIPIME) 2 g in sodium chloride 0.9 % 100 mL IVPB        2 g 200 mL/hr over 30 Minutes Intravenous Every 8 hours 05/28/22 0620          Objective: Vitals:   05/28/22 1606 05/28/22 1922 05/29/22 0343 05/29/22 0747  BP: (!) 127/58 119/67 (!) 113/55 113/60  Pulse: (!) 56 67 60 (!) 49  Resp: 17 18 18 18   Temp: 98.2 F (36.8 C) 98 F (36.7 C) 98 F (36.7 C) 97.9 F (36.6 C)  TempSrc: Oral Oral Oral Oral  SpO2: 99% 97% 97% 98%  Weight:      Height:       No intake or output data in the 24 hours ending 05/29/22 1209  Filed Weights   05/27/22 1830  Weight: 100.2 kg    Examination:  General exam: Appears calm and comfortable  Respiratory system: Clear to auscultation. Respiratory effort normal. No respiratory distress. No conversational dyspnea.  Cardiovascular system: S1 & S2 heard, RRR. No murmurs.  Central nervous system: Alert and oriented. No focal neurological deficits. Speech clear.  Extremities: left index finger in dry and clean dressing  Psychiatry: Judgement and insight appear normal. Mood & affect appropriate.   Data Reviewed: I have personally reviewed following labs and imaging  studies  CBC: Recent Labs  Lab 05/27/22 1840 05/28/22 0619 05/29/22 0401  WBC 13.8* 12.1* 7.9  HGB 14.8 14.0 15.3  HCT 43.8 39.1 44.6  MCV 86.9 83.0 86.1  PLT 287 219 778    Basic Metabolic Panel: Recent Labs  Lab 05/27/22 1840 05/28/22 0619  NA 137 138  K 3.4* 3.7  CL 104 107  CO2 22 24  GLUCOSE 98 95  BUN 17 11  CREATININE 0.77 0.82  CALCIUM 9.6 8.9  MG  --  1.7  PHOS  --  3.5    GFR: Estimated Creatinine Clearance: 166.7 mL/min (by C-G formula based on SCr of 0.82 mg/dL). Liver Function Tests: Recent Labs  Lab 05/28/22 0619  AST 20  ALT 28  ALKPHOS 55  BILITOT 1.6*   PROT 6.4*  ALBUMIN 3.8    No results for input(s): "LIPASE", "AMYLASE" in the last 168 hours. No results for input(s): "AMMONIA" in the last 168 hours. Coagulation Profile: No results for input(s): "INR", "PROTIME" in the last 168 hours. Cardiac Enzymes: No results for input(s): "CKTOTAL", "CKMB", "CKMBINDEX", "TROPONINI" in the last 168 hours. BNP (last 3 results) No results for input(s): "PROBNP" in the last 8760 hours. HbA1C: No results for input(s): "HGBA1C" in the last 72 hours. CBG: No results for input(s): "GLUCAP" in the last 168 hours. Lipid Profile: No results for input(s): "CHOL", "HDL", "LDLCALC", "TRIG", "CHOLHDL", "LDLDIRECT" in the last 72 hours. Thyroid Function Tests: No results for input(s): "TSH", "T4TOTAL", "FREET4", "T3FREE", "THYROIDAB" in the last 72 hours. Anemia Panel: No results for input(s): "VITAMINB12", "FOLATE", "FERRITIN", "TIBC", "IRON", "RETICCTPCT" in the last 72 hours. Sepsis Labs: No results for input(s): "PROCALCITON", "LATICACIDVEN" in the last 168 hours.  Recent Results (from the past 240 hour(s))  Culture, blood (Routine X 2) w Reflex to ID Panel     Status: None (Preliminary result)   Collection Time: 05/27/22  8:50 PM   Specimen: BLOOD  Result Value Ref Range Status   Specimen Description BLOOD SITE NOT SPECIFIED  Final   Special Requests   Final    BOTTLES DRAWN AEROBIC AND ANAEROBIC Blood Culture results may not be optimal due to an excessive volume of blood received in culture bottles   Culture   Final    NO GROWTH 2 DAYS Performed at [facility] [address], [place], [place] [address]    Report Status PENDING  Incomplete  Culture, blood (Routine X 2) w Reflex to ID Panel     Status: None (Preliminary result)   Collection Time: 05/27/22  8:55 PM   Specimen: BLOOD  Result Value Ref Range Status   Specimen Description BLOOD SITE NOT SPECIFIED  Final   Special Requests   Final    BOTTLES DRAWN AEROBIC AND ANAEROBIC  Blood Culture adequate volume   Culture   Final    NO GROWTH 2 DAYS Performed at [facility], [address], [place], [place] [address]    Report Status PENDING  Incomplete  Aerobic/Anaerobic Culture w Gram Stain (surgical/deep wound)     Status: None (Preliminary result)   Collection Time: 05/27/22 11:31 PM   Specimen: Finger, Left; Abscess  Result Value Ref Range Status   Specimen Description WOUND  Final   Special Requests LEFT INDEX FINGER TIP ABSC SPEC A  Final   Gram Stain NO WBC SEEN RARE GRAM POSITIVE COCCI   Final   Culture   Final    FEW STAPHYLOCOCCUS AUREUS SUSCEPTIBILITIES TO  FOLLOW Performed at [facility], [address], [place], [place] [address]    Report Status PENDING  Incomplete  Aerobic/Anaerobic Culture w Gram Stain (surgical/deep wound)     Status: None (Preliminary result)   Collection Time: 05/27/22 11:51 PM   Specimen: Bone; Tissue  Result Value Ref Range Status   Specimen Description TISSUE  Final   Special Requests LEFT INDEX FINGER BNE AND TIS DEEP SPEC B  Final   Gram Stain   Final    NO WBC SEEN NO ORGANISMS SEEN Performed at [facility], [address], [place], [place] [address]    Culture FEW STAPHYLOCOCCUS AUREUS  Final   Report Status PENDING  Incomplete      Radiology Studies: No results found.    Scheduled Meds:  enoxaparin (LOVENOX) injection  40 mg Subcutaneous Daily   senna-docusate  1 tablet Oral QHS   Continuous Infusions:  ceFEPime (MAXIPIME) IV Stopped (05/29/22 0606)   metronidazole 500 mg (05/29/22 0931)   vancomycin 1,000 mg (05/29/22 1093)     LOS: 1 day   Time spent: 20 minutes   Noralee Stain, DO [facility] 05/29/2022, 12:09 PM   Available via Epic secure chat 7am-7pm After these hours, please refer to coverage provider listed on [email or web address]

## 2022-05-30 DIAGNOSIS — M19042 Primary osteoarthritis, left hand: Secondary | ICD-10-CM | POA: Diagnosis not present

## 2022-05-30 MED ORDER — CEFADROXIL 500 MG PO CAPS: 1000.0000 mg | ORAL_CAPSULE | Freq: Two times a day (BID) | ORAL | 0 refills | Status: DC

## 2022-05-30 NOTE — Progress Notes (Signed)
PROGRESS NOTE    William Rollins  DJM:[id number] DOB: May 18, 1995 DOA: 05/27/2022 PCP: Patient, No Pcp Per     Brief Narrative:  William Rollins is a 27 y.o. male with medical history significant for former cocaine and THC abuse in 2018, left index finger infection, started on 05/06/2022 after he popped a blood blister from his finger.  The patient does manual work for a living. Has popped the blistered area a few times and noted some purulent drainage. Denies subjective fevers or chills.   He was initially started on antibiotics outpatient, Keflex 4 times daily, 500 mg for 7 days, after being evaluated at the urgent care on 05/06/2022. The course of antibiotics was extended on 05/17/2022 for 5 more days by his PCP. He went to see his hand surgeon in the office today.  Imaging revealed findings of suspected left index finger osteomyelitis on xray.  He ultimately underwent debridement 1/26.   New events last 24 hours / Subjective: Patient has no complaints today.    Assessment & Plan:   Principal Problem:   Osteoarthritis of finger, left Active Problems:   Finger osteomyelitis, left (HCC)  Left index finger osteomyelitis -Status post debridement 1/26 -Wound culture showing Staph aureus.  Susceptibilities to follow -Continue vancomycin, cefepime and Flagyl -Leukocytosis resolved.  CRP normal -Monitor CBC, CRP -Nonweightbearing left upper extremity    DVT prophylaxis:  enoxaparin (LOVENOX) injection 40 mg Start: 05/29/22 1145 SCDs Start: 05/27/22 2051  Code Status: Full Family Communication: Dad at bedside Disposition Plan:  Status is: Inpatient Remains inpatient appropriate because: IV antibiotics     Antimicrobials:  Anti-infectives (From admission, onward)    Start     Dose/Rate Route Frequency Ordered Stop   05/28/22 0800  metroNIDAZOLE (FLAGYL) IVPB 500 mg        500 mg 100 mL/hr over 60 Minutes Intravenous Every 12 hours 05/28/22 0617     05/28/22 0715  vancomycin  (VANCOCIN) IVPB 1000 mg/200 mL premix        1,000 mg 200 mL/hr over 60 Minutes Intravenous Every 8 hours 05/28/22 0626     05/28/22 0630  ceFEPIme (MAXIPIME) 2 g in sodium chloride 0.9 % 100 mL IVPB        2 g 200 mL/hr over 30 Minutes Intravenous Every 8 hours 05/28/22 0620          Objective: Vitals:   05/29/22 1649 05/29/22 1940 05/30/22 0501 05/30/22 0840  BP: 123/69 (!) 113/57 99/67 104/60  Pulse: (!) 54 (!) 59 74 (!) 53  Resp: 18 18 18 16   Temp: 98.2 F (36.8 C) 97.8 F (36.6 C) 98 F (36.7 C) 98.1 F (36.7 C)  TempSrc: Oral Oral Oral   SpO2: 98% 94% 98% 97%  Weight:      Height:        Intake/Output Summary (Last 24 hours) at 05/30/2022 1140 Last data filed at 05/30/2022 2229 Gross per 24 hour  Intake 1180 ml  Output --  Net 1180 ml    Filed Weights   05/27/22 1830  Weight: 100.2 kg    Examination:  General exam: Appears calm and comfortable  Extremities: left index finger in dry and clean dressing  Psychiatry: Judgement and insight appear normal. Mood & affect appropriate.   Data Reviewed: I have personally reviewed following labs and imaging studies  CBC: Recent Labs  Lab 05/27/22 1840 05/28/22 0619 05/29/22 0401  WBC 13.8* 12.1* 7.9  HGB 14.8 14.0 15.3  HCT 43.8 39.1  44.6  MCV 86.9 83.0 86.1  PLT 287 219 211    Basic Metabolic Panel: Recent Labs  Lab 05/27/22 1840 05/28/22 0619  NA 137 138  K 3.4* 3.7  CL 104 107  CO2 22 24  GLUCOSE 98 95  BUN 17 11  CREATININE 0.77 0.82  CALCIUM 9.6 8.9  MG  --  1.7  PHOS  --  3.5    GFR: Estimated Creatinine Clearance: 166.7 mL/min (by C-G formula based on SCr of 0.82 mg/dL). Liver Function Tests: Recent Labs  Lab 05/28/22 0619  AST 20  ALT 28  ALKPHOS 55  BILITOT 1.6*  PROT 6.4*  ALBUMIN 3.8    No results for input(s): "LIPASE", "AMYLASE" in the last 168 hours. No results for input(s): "AMMONIA" in the last 168 hours. Coagulation Profile: No results for input(s): "INR",  "PROTIME" in the last 168 hours. Cardiac Enzymes: No results for input(s): "CKTOTAL", "CKMB", "CKMBINDEX", "TROPONINI" in the last 168 hours. BNP (last 3 results) No results for input(s): "PROBNP" in the last 8760 hours. HbA1C: No results for input(s): "HGBA1C" in the last 72 hours. CBG: No results for input(s): "GLUCAP" in the last 168 hours. Lipid Profile: No results for input(s): "CHOL", "HDL", "LDLCALC", "TRIG", "CHOLHDL", "LDLDIRECT" in the last 72 hours. Thyroid Function Tests: No results for input(s): "TSH", "T4TOTAL", "FREET4", "T3FREE", "THYROIDAB" in the last 72 hours. Anemia Panel: No results for input(s): "VITAMINB12", "FOLATE", "FERRITIN", "TIBC", "IRON", "RETICCTPCT" in the last 72 hours. Sepsis Labs: No results for input(s): "PROCALCITON", "LATICACIDVEN" in the last 168 hours.  Recent Results (from the past 240 hour(s))  Culture, blood (Routine X 2) w Reflex to ID Panel     Status: None (Preliminary result)   Collection Time: 05/27/22  8:50 PM   Specimen: BLOOD  Result Value Ref Range Status   Specimen Description BLOOD SITE NOT SPECIFIED  Final   Special Requests   Final    BOTTLES DRAWN AEROBIC AND ANAEROBIC Blood Culture results may not be optimal due to an excessive volume of blood received in culture bottles   Culture   Final    NO GROWTH 3 DAYS Performed at [facility], [address], [place], [place] [address]    Report Status PENDING  Incomplete  Culture, blood (Routine X 2) w Reflex to ID Panel     Status: None (Preliminary result)   Collection Time: 05/27/22  8:55 PM   Specimen: BLOOD  Result Value Ref Range Status   Specimen Description BLOOD SITE NOT SPECIFIED  Final   Special Requests   Final    BOTTLES DRAWN AEROBIC AND ANAEROBIC Blood Culture adequate volume   Culture   Final    NO GROWTH 3 DAYS Performed at [facility], [address], [place], [place] [address]    Report Status PENDING  Incomplete  Aerobic/Anaerobic Culture w  Gram Stain (surgical/deep wound)     Status: None (Preliminary result)   Collection Time: 05/27/22 11:31 PM   Specimen: Finger, Left; Abscess  Result Value Ref Range Status   Specimen Description WOUND  Final   Special Requests LEFT INDEX FINGER TIP ABSC SPEC A  Final   Gram Stain NO WBC SEEN RARE GRAM POSITIVE COCCI   Final   Culture   Final    FEW STAPHYLOCOCCUS AUREUS SUSCEPTIBILITIES TO FOLLOW Performed at [facility], [address] [place], [place] [address]    Report Status PENDING  Incomplete  Aerobic/Anaerobic Culture w Gram  Stain (surgical/deep wound)     Status: None (Preliminary result)   Collection Time: 05/27/22 11:51 PM   Specimen: Bone; Tissue  Result Value Ref Range Status   Specimen Description TISSUE  Final   Special Requests LEFT INDEX FINGER BNE AND TIS DEEP SPEC B  Final   Gram Stain   Final    NO WBC SEEN NO ORGANISMS SEEN Performed at [facility], [address], [place], [place] [address]    Culture FEW STAPHYLOCOCCUS AUREUS  Final   Report Status PENDING  Incomplete  Acid Fast Smear (AFB)     Status: None   Collection Time: 05/27/22 11:51 PM   Specimen: Bone; Tissue  Result Value Ref Range Status   AFB Specimen Processing Comment  Final    Comment: Tissue Grinding and Digestion/Decontamination   Acid Fast Smear Negative  Final    Comment: (NOTE) Performed At: [facility] [place], [place] [id number] Rush Farmer MD LG:[id number]    Source (AFB) LEFT INDEX FINGER BNE AND TIS DEEP  Final    Comment: Performed at [facility], [place] [address], [place], [place] [address]      Radiology Studies: No results found.    Scheduled Meds:  enoxaparin (LOVENOX) injection  40 mg Subcutaneous Daily   senna-docusate  1 tablet Oral QHS   Continuous Infusions:  ceFEPime (MAXIPIME) IV 2 g (05/30/22 0539)   metronidazole 500 mg (05/30/22 0917)   vancomycin 1,000 mg (05/30/22 1856)     LOS: 2 days   Time  spent: 20 minutes   Dessa Phi, DO [facility] 05/30/2022, 11:40 AM   Available via Epic secure chat 7am-7pm After these hours, please refer to coverage provider listed on [email or web address]

## 2022-05-30 NOTE — TOC Transition Note (Addendum)
Transition of Care [facility]) - CM/SW Discharge Note   Patient Details  Name: William Rollins MRN: [id number] Date of Birth: 02-03-1996  [facility] CM/SW Contact:  Carles Collet, RN Phone Number: 05/30/2022, 4:16 PM   Clinical Narrative:     Verified w attending, Dr Maylene Roes that [facility] does not need to set up home IV Abx as per attending this will be done between the patient and ID after DC, as the patient is wanting to leave the hospital now.  No further TOC needs identified        Patient Goals and CMS Choice      Discharge Placement                         Discharge Plan and Services Additional resources added to the After Visit Summary for                                       Social Determinants of Health (SDOH) Interventions SDOH Screenings   Alcohol Screen: Medium Risk (03/09/2017)  Tobacco Use: High Risk (05/28/2022)     Readmission Risk Interventions     No data to display

## 2022-05-30 NOTE — Consult Note (Signed)
[place] for Infectious Disease       Reason for Consult: osteomyelitis distal finger   Referring Physician: Dr. Maylene Roes  Principal Problem:   Osteoarthritis of finger, left Active Problems:   Finger osteomyelitis, left (HCC)    enoxaparin (LOVENOX) injection  40 mg Subcutaneous Daily   senna-docusate  1 tablet Oral QHS    Recommendations: Cefazolin 2 grams three times a day for 3 weeks   Cefadroxil 1000 mg twice a day until that is arranged Plan for 6 weeks of treatment  Diagnosis: Osteomyelitis of distal finger  Culture Result: MSSA  No Known Allergies  OPAT Orders Discharge antibiotics to be given via PICC line Discharge antibiotics: cefazolin 2 grams IV three times a day Per pharmacy protocol yes Duration: 21 days   [facility] Care Per Protocol: yes  Home health RN for IV administration and teaching; PICC line care and labs.    Labs weekly while on IV antibiotics: _x_ CBC with differential __ BMP _x_ CMP _x_ CRP _x_ ESR __ Vancomycin trough __ CK  _x_ Please pull PIC at completion of IV antibiotics __ Please leave PIC in place until doctor has seen patient or been notified  Fax weekly labs to [phone]  Clinic Follow Up Appt: 2/2 at 8:45  Assessment: He has osteomyelitis of the distal finger s/p debridement.  Optimal treament with IV therapy for 2-3 weeks followed by cefadroxil to complete 6 weeks.  Since he is leaving now, will arrange this as an outpatient   Antibiotics: Vancomycin, cefepime, metronidazole during the hospitalization  HPI: William Rollins is a 27 y.o. male with remote drug use but none now developed a blister on his finger that developed an infection with osteomyelitis on a bone biopsy done intraoperatively.  He had been treated with cephalexin with no improvement and debrided on 1/26.  MSSA on culture.    Review of Systems:  Constitutional: negative for fevers and chills All other systems reviewed and are negative     History reviewed. No pertinent past medical history.  Social History   Tobacco Use   Smoking status: Every Day    Packs/day: 0.50    Types: Cigarettes   Smokeless tobacco: Never  Substance Use Topics   Alcohol use: Yes    Alcohol/week: 7.0 standard drinks of alcohol    Types: 7 Cans of beer per week   Drug use: Yes    Types: Marijuana    Family History  Problem Relation Age of Onset   Hypertension Father     No Known Allergies  Physical Exam: Constitutional: in no apparent distress  Vitals:   05/30/22 0501 05/30/22 0840  BP: 99/67 104/60  Pulse: 74 (!) 53  Resp: 18 16  Temp: 98 F (36.7 C) 98.1 F (36.7 C)  SpO2: 98% 97%   EYES: anicteric Respiratory: normal respiratory effort Musculoskeletal: finger wrapped  Lab Results  Component Value Date   WBC 7.9 05/29/2022   HGB 15.3 05/29/2022   HCT 44.6 05/29/2022   MCV 86.1 05/29/2022   PLT 211 05/29/2022    Lab Results  Component Value Date   CREATININE 0.82 05/28/2022   BUN 11 05/28/2022   NA 138 05/28/2022   K 3.7 05/28/2022   CL 107 05/28/2022   CO2 24 05/28/2022    Lab Results  Component Value Date   ALT 28 05/28/2022   AST 20 05/28/2022   ALKPHOS 55 05/28/2022     Microbiology: Recent Results (from the past  240 hour(s))  Culture, blood (Routine X 2) w Reflex to ID Panel     Status: None (Preliminary result)   Collection Time: 05/27/22  8:50 PM   Specimen: BLOOD  Result Value Ref Range Status   Specimen Description BLOOD SITE NOT SPECIFIED  Final   Special Requests   Final    BOTTLES DRAWN AEROBIC AND ANAEROBIC Blood Culture results may not be optimal due to an excessive volume of blood received in culture bottles   Culture   Final    NO GROWTH 3 DAYS Performed at [facility], [address], [place], [place] [address]    Report Status PENDING  Incomplete  Culture, blood (Routine X 2) w Reflex to ID Panel     Status: None (Preliminary result)   Collection Time: 05/27/22  8:55 PM    Specimen: BLOOD  Result Value Ref Range Status   Specimen Description BLOOD SITE NOT SPECIFIED  Final   Special Requests   Final    BOTTLES DRAWN AEROBIC AND ANAEROBIC Blood Culture adequate volume   Culture   Final    NO GROWTH 3 DAYS Performed at [facility], [address] [place], [place] [address]    Report Status PENDING  Incomplete  Aerobic/Anaerobic Culture w Gram Stain (surgical/deep wound)     Status: None (Preliminary result)   Collection Time: 05/27/22 11:31 PM   Specimen: Finger, Left; Abscess  Result Value Ref Range Status   Specimen Description WOUND  Final   Special Requests LEFT INDEX FINGER TIP ABSC SPEC A  Final   Gram Stain   Final    NO WBC SEEN RARE GRAM POSITIVE COCCI Performed at [facility], [address], [place], [place] [address]    Culture   Final    FEW STAPHYLOCOCCUS AUREUS NO ANAEROBES ISOLATED; CULTURE IN PROGRESS FOR 5 DAYS    Report Status PENDING  Incomplete   Organism ID, Bacteria STAPHYLOCOCCUS AUREUS  Final      Susceptibility   Staphylococcus aureus - MIC*    CIPROFLOXACIN >=8 RESISTANT Resistant     ERYTHROMYCIN <=0.25 SENSITIVE Sensitive     GENTAMICIN <=0.5 SENSITIVE Sensitive     OXACILLIN <=0.25 SENSITIVE Sensitive     TETRACYCLINE <=1 SENSITIVE Sensitive     VANCOMYCIN <=0.5 SENSITIVE Sensitive     TRIMETH/SULFA <=10 SENSITIVE Sensitive     CLINDAMYCIN <=0.25 SENSITIVE Sensitive     RIFAMPIN <=0.5 SENSITIVE Sensitive     Inducible Clindamycin NEGATIVE Sensitive     * FEW STAPHYLOCOCCUS AUREUS  Aerobic/Anaerobic Culture w Gram Stain (surgical/deep wound)     Status: None (Preliminary result)   Collection Time: 05/27/22 11:51 PM   Specimen: Bone; Tissue  Result Value Ref Range Status   Specimen Description TISSUE  Final   Special Requests LEFT INDEX FINGER BNE AND TIS DEEP SPEC B  Final   Gram Stain   Final    NO WBC SEEN NO ORGANISMS SEEN Performed at [facility], [address] [place],  [place] [address]    Culture   Final    FEW STAPHYLOCOCCUS AUREUS SUSCEPTIBILITIES PERFORMED ON PREVIOUS CULTURE WITHIN THE LAST 5 DAYS. FEW STAPHYLOCOCCUS EPIDERMIDIS NO ANAEROBES ISOLATED; CULTURE IN PROGRESS FOR 5 DAYS    Report Status PENDING  Incomplete  Acid Fast Smear (AFB)     Status: None   Collection Time: 05/27/22 11:51 PM   Specimen: Bone; Tissue  Result Value Ref Range Status  AFB Specimen Processing Comment  Final    Comment: Tissue Grinding and Digestion/Decontamination   Acid Fast Smear Negative  Final    Comment: (NOTE) Performed At: [facility] [place], [place] [id number] Rush Farmer MD RK:[id number]    Source (AFB) LEFT INDEX FINGER BNE AND TIS DEEP  Final    Comment: Performed at [facility], [place] [address], [place], [place] [address]    Abel Ra W Lashan Macias, [place] for Infectious Disease [facility] [email or web address] 05/30/2022, 3:31 PM

## 2022-05-30 NOTE — Progress Notes (Signed)
Ortho Hand Progress Note  S: s/p I&D of left index finger with bone biopsy for osteomyelitis.  Patient has been on cefepime, metronidazole, and vancomycin.  Cultures positive for MSSA, confirming osteomyelitis.  Patient reports pain is well controlled except for one instance where he hit his finger on the bed rail and had significant pain requiring pain medication.  O: dressing removed with underlying gauze with significant shadowing dried blood.  No drainage present.  Incision intact except for section just distal to nail plate that may have been due to hitting his finger which would explain amount of dressing shadowing or may be hypertrophic granulation tissue. Slight mild erythema around surgical incision, benign appearing and non-tender.  Tip/surgical site is non-tender.  Did not have patient flex/extend DIP due to acuity of surgery.  Sensation intact to light touch at distal tip.    WBC no longer elevated now 7.9 CRP normal 0.5 Bone culture - Staph aureus susceptible to everything except cipro   A/P: 10M with left index distal phalanx osteomyelitis s/p I&D and bone biopsy on 1/25.    Recommend ID consult for abx recommendations and outpatient abx management as treatment is typically for 6 weeks. Okay to d/c once seen by ID and outpatient follow up set up.  Patient will start Dial soap soaks TID when discharged.  Instructions were placed in the discharge instructions section and will need to be printed out and given to patient at d/c.  I went over the instructions with the patient and his father and demonstrated how to re-dress the wound.  They feel comfortable with this.  All questions were answered.    Patient will need to follow up with me in my office in 1 week.  Wadie Lessen, MD Hand & Upper Extremity Surgery [facility] [place]

## 2022-05-30 NOTE — Discharge Instructions (Signed)
[facility] Hand [facility] Post-Operative Instructions    General -These instructions are to compliment information given to you by your surgeon. -You may resume your normal diet as tolerated.  -You may resume your normal medications unless specifically instructed to stop taking a certain medication. -Take any new medications/antibiotics as prescribed.  For antibiotics, it is very important to complete the entire prescription.   Post-Operative Dressings  DIAL SOAP SOAKS   Supplies needed: -Liquid Dial Antibacterial soap -clean basin large enough to submerge wound (to be rinsed and dried between uses) -clean dry towel or gauze -Over-the-counter "Non-Adherent" Petroleum impregnated dressing (Adaptec or similar) -Over-the-counter Telfa gauze dressing ("non-adherent") -Sterile gauze 2"x2" or 4"x4" -comfortable tape or self adhesive wrap (1" Coban)   Perform 3 times per day: Soak wound in soapy water for 10-15 minutes in clean basin Gently pat dry Place petroleum impregnated dressing (Adaptec or similar) or Telfa pad over the wound Cover with fresh sterile gauze and loosely wrap  Other than the Dial soap soaks, do not soak the wound in a tub, pool, dishwater, etc and do not scrub the incision. After washing, pat your incision dry and then apply a dry bandaid or gauze to cover.    If you have questions about your dressings, please call the clinic.   Post-operative Wound Care In general:  Any sutures or anything that will not fall off over time on their own, will be removed in clinic by our staff.  Regardless of any sutures, stickers or glue used to close your wound, do not submerge the wound in any standing water for at least 4 weeks after surgery.  It is okay to let the shower water run over the wound and gently clean the wound with soapy water then rinse and gently pat dry.   Weightbearing/Activity  Do not use your operative extremity for  any lifting or weight bearing  Please start gentle finger range of motion exercises right away.  If you have a splint in place, you should move all joints that are not immobilized by a splint.   Pain Control - After your surgery, post-surgical discomfort or pain is normal. This discomfort can last several days to a few weeks. At certain times of the day (usually evenings/nights) your discomfort may be more intense.  - Elevation and icing will help improve pain.  Pain Medication:  Typically, post operative pain can be managed by alternating tylenol and an anti inflammatory medication.   Other pain relieving options:  Elevation: Elevating your operative extremity can be very helpful to reduce this pain. Prop your arm up on pillows to keep the operative site above the level of your heart. If you develop tingling from prolonged elevation, take a break from elevating and this should resolve.  Icing: If you do not have a splint/cast, using a cold pack to ice the affected area a few times a day (15 to 20 minutes at a time) can also help to relieve pain, reduce swelling and bruising.    Follow Up Please call [facility] at [phone] if you do not receive or are unsure of your first follow-up appointment.  You should see your surgeon or PA 10-16 days after your surgery unless otherwise instructed.   Please call the office for any problems, including the following:  - Excessive redness of the incisions - Drainage for more than 4 days - Fever of more than 101.5 F - Nausea/vomiting that does  not stop  - Numbness, tingling, or discoloration of extremity  - Unable to drink fluids  - Uncontrollable pain     Wadie Lessen, MD Hand and Upper Extremity Surgery [facility] [place]

## 2022-05-30 NOTE — Plan of Care (Signed)
  Problem: Education: Goal: Knowledge of General Education information will improve Description: Including pain rating scale, medication(s)/side effects and non-pharmacologic comfort measures Outcome: Progressing   Problem: Health Behavior/Discharge Planning: Goal: Ability to manage health-related needs will improve Outcome: Progressing   Problem: Clinical Measurements: Goal: Will remain free from infection Outcome: Progressing   Problem: Safety: Goal: Ability to remain free from injury will improve Outcome: Progressing   Problem: Skin Integrity: Goal: Risk for impaired skin integrity will decrease Outcome: Progressing

## 2022-05-30 NOTE — Discharge Summary (Signed)
Physician Discharge Summary  EIN RIJO FFM:[id number] DOB: [birth year] DOA: 05/27/2022  PCP: Patient, No Pcp Per  Admit date: 05/27/2022 Discharge date: 05/30/2022  Admitted From: Home Disposition:  Home   Recommendations for Outpatient Follow-up:  Follow up with Orthopedic surgery Dr. Kerry Fort in 1 week  Follow up with ID Dr. Luciana Axe. Continue cefadroxil until follow up outpatient for PICC and cefazolin for 6 week treatment.   Discharge Condition: Stable CODE STATUS: Full  Diet recommendation: Regular   Brief/Interim Summary: AMEDEO Rollins is a 27 y.o. male with medical history significant for former cocaine and THC abuse in 2018, left index finger infection, started on 05/06/2022 after he popped a blood blister from his finger.  The patient does manual work for a living. Has popped the blistered area a few times and noted some purulent drainage. Denies subjective fevers or chills.   He was initially started on antibiotics outpatient, Keflex 4 times daily, 500 mg for 7 days, after being evaluated at the urgent care on 05/06/2022. The course of antibiotics was extended on 05/17/2022 for 5 more days by his PCP. He went to see his hand surgeon in the office today.  Imaging revealed findings of suspected left index finger osteomyelitis on xray.  He ultimately underwent debridement 1/26. Wound culture showed staph aureus. Consulted with ID and recommended for cefadroxil until follow up outpatient for PICC and cefazolin for 6 week treatment.   Discharge Diagnoses:   Principal Problem:   Osteoarthritis of finger, left Active Problems:   Finger osteomyelitis, left [name])   Discharge Instructions  Discharge Instructions     AMB referral to orthopedics   Complete by: As directed    Follow up in approx 1 week from 1/28   Call MD for:  difficulty breathing, headache or visual disturbances   Complete by: As directed    Call MD for:  extreme fatigue   Complete by: As directed    Call MD  for:  hives   Complete by: As directed    Call MD for:  persistant dizziness or light-headedness   Complete by: As directed    Call MD for:  persistant nausea and vomiting   Complete by: As directed    Call MD for:  redness, tenderness, or signs of infection (pain, swelling, redness, odor or green/yellow discharge around incision site)   Complete by: As directed    Call MD for:  severe uncontrolled pain   Complete by: As directed    Call MD for:  temperature >100.4   Complete by: As directed    Diet general   Complete by: As directed    Discharge instructions   Complete by: As directed    You were cared for by a hospitalist during your hospital stay. If you have any questions about your discharge medications or the care you received while you were in the hospital after you are discharged, you can call the unit and ask to speak with the hospitalist on call if the hospitalist that took care of you is not available. Once you are discharged, your primary care physician will handle any further medical issues. Please note that NO REFILLS for any discharge medications will be authorized once you are discharged, as it is imperative that you return to your primary care physician (or establish a relationship with a primary care physician if you do not have one) for your aftercare needs so that they can reassess your need for medications and monitor your lab values.  Increase activity slowly   Complete by: As directed       Allergies as of 05/30/2022   No Known Allergies      Medication List     TAKE these medications    cefadroxil 500 MG capsule Commonly known as: DURICEF Take 2 capsules (1,000 mg total) by mouth 2 (two) times daily for 7 days.        Follow-up Information     Chiaramonti, Edsel Petrin, MD. Go in 1 week(s).   Specialties: Orthopedic Surgery, Hand Surgery Contact information: [address] [place] [address] [phone]         Gardiner Barefoot, MD Follow up.    Specialty: Infectious Diseases Contact information: [address] [place] [address] [phone]                No Known Allergies   Discharge Exam: Vitals:   05/30/22 0501 05/30/22 0840  BP: 99/67 104/60  Pulse: 74 (!) 53  Resp: 18 16  Temp: 98 F (36.7 C) 98.1 F (36.7 C)  SpO2: 98% 97%     General exam: Appears calm and comfortable  Extremities: left index finger in dry and clean dressing  Psychiatry: Judgement and insight appear normal. Mood & affect appropriate.     The results of significant diagnostics from this hospitalization (including imaging, microbiology, ancillary and laboratory) are listed below for reference.     Microbiology: Recent Results (from the past 240 hour(s))  Culture, blood (Routine X 2) w Reflex to ID Panel     Status: None (Preliminary result)   Collection Time: 05/27/22  8:50 PM   Specimen: BLOOD  Result Value Ref Range Status   Specimen Description BLOOD SITE NOT SPECIFIED  Final   Special Requests   Final    BOTTLES DRAWN AEROBIC AND ANAEROBIC Blood Culture results may not be optimal due to an excessive volume of blood received in culture bottles   Culture   Final    NO GROWTH 3 DAYS Performed at [facility], [address] [place], [place] [address]    Report Status PENDING  Incomplete  Culture, blood (Routine X 2) w Reflex to ID Panel     Status: None (Preliminary result)   Collection Time: 05/27/22  8:55 PM   Specimen: BLOOD  Result Value Ref Range Status   Specimen Description BLOOD SITE NOT SPECIFIED  Final   Special Requests   Final    BOTTLES DRAWN AEROBIC AND ANAEROBIC Blood Culture adequate volume   Culture   Final    NO GROWTH 3 DAYS Performed at [facility], [address], [place], [place] [address]    Report Status PENDING  Incomplete  Aerobic/Anaerobic Culture w Gram Stain (surgical/deep wound)     Status: None (Preliminary result)   Collection Time: 05/27/22 11:31 PM    Specimen: Finger, Left; Abscess  Result Value Ref Range Status   Specimen Description WOUND  Final   Special Requests LEFT INDEX FINGER TIP ABSC SPEC A  Final   Gram Stain   Final    NO WBC SEEN RARE GRAM POSITIVE COCCI Performed at [facility], [address], [place], [place] [address]    Culture   Final    FEW STAPHYLOCOCCUS AUREUS NO ANAEROBES ISOLATED; CULTURE IN PROGRESS FOR 5 DAYS    Report Status PENDING  Incomplete   Organism ID, Bacteria STAPHYLOCOCCUS AUREUS  Final      Susceptibility  Staphylococcus aureus - MIC*    CIPROFLOXACIN >=8 RESISTANT Resistant     ERYTHROMYCIN <=0.25 SENSITIVE Sensitive     GENTAMICIN <=0.5 SENSITIVE Sensitive     OXACILLIN <=0.25 SENSITIVE Sensitive     TETRACYCLINE <=1 SENSITIVE Sensitive     VANCOMYCIN <=0.5 SENSITIVE Sensitive     TRIMETH/SULFA <=10 SENSITIVE Sensitive     CLINDAMYCIN <=0.25 SENSITIVE Sensitive     RIFAMPIN <=0.5 SENSITIVE Sensitive     Inducible Clindamycin NEGATIVE Sensitive     * FEW STAPHYLOCOCCUS AUREUS  Aerobic/Anaerobic Culture w Gram Stain (surgical/deep wound)     Status: None (Preliminary result)   Collection Time: 05/27/22 11:51 PM   Specimen: Bone; Tissue  Result Value Ref Range Status   Specimen Description TISSUE  Final   Special Requests LEFT INDEX FINGER BNE AND TIS DEEP SPEC B  Final   Gram Stain   Final    NO WBC SEEN NO ORGANISMS SEEN Performed at [facility], [address] [place], [place] [address]    Culture   Final    FEW STAPHYLOCOCCUS AUREUS SUSCEPTIBILITIES PERFORMED ON PREVIOUS CULTURE WITHIN THE LAST 5 DAYS. FEW STAPHYLOCOCCUS EPIDERMIDIS NO ANAEROBES ISOLATED; CULTURE IN PROGRESS FOR 5 DAYS    Report Status PENDING  Incomplete  Acid Fast Smear (AFB)     Status: None   Collection Time: 05/27/22 11:51 PM   Specimen: Bone; Tissue  Result Value Ref Range Status   AFB Specimen Processing Comment  Final    Comment: Tissue Grinding and Digestion/Decontamination   Acid  Fast Smear Negative  Final    Comment: (NOTE) Performed At: [facility] [place], [place] [id number] Rush Farmer MD AO:[id number]    Source (AFB) LEFT INDEX FINGER BNE AND TIS DEEP  Final    Comment: Performed at [facility], [place] [address], [place], [place] [address]     Labs: BNP (last 3 results) No results for input(s): "BNP" in the last 8760 hours. Basic Metabolic Panel: Recent Labs  Lab 05/27/22 1840 05/28/22 0619  NA 137 138  K 3.4* 3.7  CL 104 107  CO2 22 24  GLUCOSE 98 95  BUN 17 11  CREATININE 0.77 0.82  CALCIUM 9.6 8.9  MG  --  1.7  PHOS  --  3.5   Liver Function Tests: Recent Labs  Lab 05/28/22 0619  AST 20  ALT 28  ALKPHOS 55  BILITOT 1.6*  PROT 6.4*  ALBUMIN 3.8   No results for input(s): "LIPASE", "AMYLASE" in the last 168 hours. No results for input(s): "AMMONIA" in the last 168 hours. CBC: Recent Labs  Lab 05/27/22 1840 05/28/22 0619 05/29/22 0401  WBC 13.8* 12.1* 7.9  HGB 14.8 14.0 15.3  HCT 43.8 39.1 44.6  MCV 86.9 83.0 86.1  PLT 287 219 211   Cardiac Enzymes: No results for input(s): "CKTOTAL", "CKMB", "CKMBINDEX", "TROPONINI" in the last 168 hours. BNP: Invalid input(s): "POCBNP" CBG: No results for input(s): "GLUCAP" in the last 168 hours. D-Dimer No results for input(s): "DDIMER" in the last 72 hours. Hgb A1c No results for input(s): "HGBA1C" in the last 72 hours. Lipid Profile No results for input(s): "CHOL", "HDL", "LDLCALC", "TRIG", "CHOLHDL", "LDLDIRECT" in the last 72 hours. Thyroid function studies No results for input(s): "TSH", "T4TOTAL", "T3FREE", "THYROIDAB" in the last 72 hours.  Invalid input(s): "FREET3" Anemia work up No results for input(s): "VITAMINB12", "FOLATE", "FERRITIN", "TIBC", "IRON", "RETICCTPCT" in the last 72 hours. Urinalysis No results found  for: "COLORURINE", "APPEARANCEUR", "LABSPEC", "PHURINE", "GLUCOSEU", "HGBUR", "BILIRUBINUR", "KETONESUR", "PROTEINUR",  "UROBILINOGEN", "NITRITE", "LEUKOCYTESUR" Sepsis Labs Recent Labs  Lab 05/27/22 1840 05/28/22 0619 05/29/22 0401  WBC 13.8* 12.1* 7.9   Microbiology Recent Results (from the past 240 hour(s))  Culture, blood (Routine X 2) w Reflex to ID Panel     Status: None (Preliminary result)   Collection Time: 05/27/22  8:50 PM   Specimen: BLOOD  Result Value Ref Range Status   Specimen Description BLOOD SITE NOT SPECIFIED  Final   Special Requests   Final    BOTTLES DRAWN AEROBIC AND ANAEROBIC Blood Culture results may not be optimal due to an excessive volume of blood received in culture bottles   Culture   Final    NO GROWTH 3 DAYS Performed at [facility], [address] [place], [place] [address]    Report Status PENDING  Incomplete  Culture, blood (Routine X 2) w Reflex to ID Panel     Status: None (Preliminary result)   Collection Time: 05/27/22  8:55 PM   Specimen: BLOOD  Result Value Ref Range Status   Specimen Description BLOOD SITE NOT SPECIFIED  Final   Special Requests   Final    BOTTLES DRAWN AEROBIC AND ANAEROBIC Blood Culture adequate volume   Culture   Final    NO GROWTH 3 DAYS Performed at [facility], [address] [place], [place] [address]    Report Status PENDING  Incomplete  Aerobic/Anaerobic Culture w Gram Stain (surgical/deep wound)     Status: None (Preliminary result)   Collection Time: 05/27/22 11:31 PM   Specimen: Finger, Left; Abscess  Result Value Ref Range Status   Specimen Description WOUND  Final   Special Requests LEFT INDEX FINGER TIP ABSC SPEC A  Final   Gram Stain   Final    NO WBC SEEN RARE GRAM POSITIVE COCCI Performed at [facility], [address] [place], [place] [address]    Culture   Final    FEW STAPHYLOCOCCUS AUREUS NO ANAEROBES ISOLATED; CULTURE IN PROGRESS FOR 5 DAYS    Report Status PENDING  Incomplete   Organism ID, Bacteria STAPHYLOCOCCUS AUREUS  Final      Susceptibility   Staphylococcus aureus -  MIC*    CIPROFLOXACIN >=8 RESISTANT Resistant     ERYTHROMYCIN <=0.25 SENSITIVE Sensitive     GENTAMICIN <=0.5 SENSITIVE Sensitive     OXACILLIN <=0.25 SENSITIVE Sensitive     TETRACYCLINE <=1 SENSITIVE Sensitive     VANCOMYCIN <=0.5 SENSITIVE Sensitive     TRIMETH/SULFA <=10 SENSITIVE Sensitive     CLINDAMYCIN <=0.25 SENSITIVE Sensitive     RIFAMPIN <=0.5 SENSITIVE Sensitive     Inducible Clindamycin NEGATIVE Sensitive     * FEW STAPHYLOCOCCUS AUREUS  Aerobic/Anaerobic Culture w Gram Stain (surgical/deep wound)     Status: None (Preliminary result)   Collection Time: 05/27/22 11:51 PM   Specimen: Bone; Tissue  Result Value Ref Range Status   Specimen Description TISSUE  Final   Special Requests LEFT INDEX FINGER BNE AND TIS DEEP SPEC B  Final   Gram Stain   Final    NO WBC SEEN NO ORGANISMS SEEN Performed at [facility], [address], [place], [place] [address]    Culture   Final    FEW STAPHYLOCOCCUS AUREUS SUSCEPTIBILITIES PERFORMED ON PREVIOUS CULTURE WITHIN THE LAST 5 DAYS. FEW STAPHYLOCOCCUS EPIDERMIDIS NO ANAEROBES ISOLATED; CULTURE IN PROGRESS FOR 5 DAYS  Report Status PENDING  Incomplete  Acid Fast Smear (AFB)     Status: None   Collection Time: 05/27/22 11:51 PM   Specimen: Bone; Tissue  Result Value Ref Range Status   AFB Specimen Processing Comment  Final    Comment: Tissue Grinding and Digestion/Decontamination   Acid Fast Smear Negative  Final    Comment: (NOTE) Performed At: [facility] [place], [place] [id number] Rush Farmer MD EM:[id number]    Source (AFB) LEFT INDEX FINGER BNE AND TIS DEEP  Final    Comment: Performed at [facility], [place] [address] [place], [place] [address]     Patient was seen and examined on the day of discharge and was found to be in stable condition. Time coordinating discharge: 35 minutes including assessment and coordination of care, as well as examination of the patient.    SIGNED:  Dessa Phi, DO [facility] 05/30/2022, 3:54 PM

## 2022-05-31 ENCOUNTER — Telehealth: Payer: Self-pay

## 2022-05-31 ENCOUNTER — Other Ambulatory Visit: Payer: Self-pay

## 2022-05-31 DIAGNOSIS — M869 Osteomyelitis, unspecified: Secondary | ICD-10-CM

## 2022-05-31 LAB — CULTURE, BLOOD (ROUTINE X 2)

## 2022-05-31 NOTE — Telephone Encounter (Signed)
Patient scheduled for picc line placement 06/07/22 @ 12pm at [facility]. Patient advised of appointment information. I have also reached out to [facility] team to give them patient appointment info. Patient has had cefadroxil in the past so no need for 1st dose at short stay.  William Rollins T Brooks Sailors

## 2022-05-31 NOTE — Anesthesia Postprocedure Evaluation (Signed)
Anesthesia Post Note  Patient: William Rollins  Procedure(s) Performed: IRRIGATION AND DEBRIDEMENT LEFT INDEX FINGER WITH BONE BIOPSY AND POSSIBLE AMPUTATION (Hand)     Patient location during evaluation: PACU Anesthesia Type: General Level of consciousness: awake and alert Pain management: pain level controlled Vital Signs Assessment: post-procedure vital signs reviewed and stable Respiratory status: spontaneous breathing, nonlabored ventilation, respiratory function stable and patient connected to nasal cannula oxygen Cardiovascular status: blood pressure returned to baseline and stable Postop Assessment: no apparent nausea or vomiting Anesthetic complications: no   No notable events documented.  Last Vitals:  Vitals:   05/30/22 0501 05/30/22 0840  BP: 99/67 104/60  Pulse: 74 (!) 53  Resp: 18 16  Temp: 36.7 C 36.7 C  SpO2: 98% 97%    Last Pain:  Vitals:   05/30/22 0501  TempSrc: Oral  PainSc:                  Tiajuana Amass

## 2022-05-31 NOTE — Telephone Encounter (Signed)
-----  Message from Thayer Headings, MD sent at 05/30/2022  3:41 PM EST ----- This patient needs to start IV cefazolin (per my inpatient consult note) and needs to be arranged outpatient - picc line and home health.  He had cefazolin already inpatient so should not need a first dose.   thanks

## 2022-06-01 LAB — CULTURE, BLOOD (ROUTINE X 2)
Culture: NO GROWTH
Special Requests: ADEQUATE

## 2022-06-01 LAB — ACID FAST SMEAR (AFB, MYCOBACTERIA): Acid Fast Smear: NEGATIVE

## 2022-06-02 LAB — AEROBIC/ANAEROBIC CULTURE W GRAM STAIN (SURGICAL/DEEP WOUND)
Gram Stain: NONE SEEN
Gram Stain: NONE SEEN

## 2022-06-04 ENCOUNTER — Telehealth: Payer: Self-pay

## 2022-06-04 ENCOUNTER — Other Ambulatory Visit: Payer: Self-pay

## 2022-06-04 ENCOUNTER — Encounter: Payer: Self-pay | Admitting: Internal Medicine

## 2022-06-04 ENCOUNTER — Ambulatory Visit: Payer: Commercial Managed Care - PPO | Admitting: Internal Medicine

## 2022-06-04 VITALS — BP 111/70 | HR 71 | Temp 97.8°F | Ht 74.0 in | Wt 219.0 lb

## 2022-06-04 DIAGNOSIS — M869 Osteomyelitis, unspecified: Secondary | ICD-10-CM | POA: Diagnosis not present

## 2022-06-04 MED ORDER — DOXYCYCLINE MONOHYDRATE 100 MG PO CAPS: 100.0000 mg | ORAL_CAPSULE | Freq: Two times a day (BID) | ORAL | 0 refills | Status: DC

## 2022-06-04 NOTE — Patient Instructions (Signed)
Stop the cefadroxil now Start the doxycycline 1 tab twice a day until you start the IV medication, then you can stop the pills.

## 2022-06-04 NOTE — Assessment & Plan Note (Signed)
New cultures noted and will change his oral antibiotic to doxycycline  I still recommend IV treatment for at least 2-3 weeks and he is scheduled for a picc line on Monday.  Will need to use daptomycin or other anti-MRSA option.   Diagnosis: Left finger osteomyelitis  Culture Result: MRSA and MRSE  No Known Allergies  OPAT Orders Discharge antibiotics to be given via PICC line Discharge antibiotics: daptomycin 8 mg/kg IV once daily Per pharmacy protocol yes Duration: 3 weeks End Date: February 25th (start date 06/07/22)  [place] Per Protocol:  Home health RN for IV administration and teaching; PICC line care and labs.    Labs weekly while on IV antibiotics: _x_ CBC with differential __ BMP _x_ CMP _x_ CRP _x_ ESR __ Vancomycin trough _x_ CK  __ Please pull PIC at completion of IV antibiotics _x_ Please leave PIC in place until doctor has seen patient or been notified  Fax weekly labs to [phone]

## 2022-06-04 NOTE — Telephone Encounter (Signed)
Per Carolynn Sayers, first dose will be able to be completed in home.   Beryle Flock, RN

## 2022-06-04 NOTE — Telephone Encounter (Signed)
OPAT orders changed from cefazolin to daptomycin. Dr. Linus Salmons to place new OPAT orders. Update sent to Carolynn Sayers, RN with [facility].   Will have her check if first dose can be done in home. Patient is already scheduled for PICC placement 06/07/22.  Beryle Flock, RN

## 2022-06-04 NOTE — Progress Notes (Signed)
   Subjective:    Patient ID: William Rollins, male    DOB: [birth year], 27 y.o.   MRN: [id number]  HPI William Rollins is here for follow up of left pointer finger osteomyelitis He was hospitalized and underwent surgical debridement by Dr. Ala Bent and had positive growth of his bone biopsy now showing MRSA and resistant Staph epidermidis.  Initial cultures with MSSA from the abscess.  He was seen in consultation on the day of discharge and not willing to wait for picc line and initiation of IV antibiotics so discharged on oral cefadroxil.  He continues on this.  No new issues.    Review of Systems  Constitutional:  Negative for fatigue.  Gastrointestinal:  Negative for diarrhea and nausea.  Skin:  Negative for rash.       Objective:   Physical Exam Eyes:     General: No scleral icterus. Pulmonary:     Effort: Pulmonary effort is normal.  Neurological:     Mental Status: He is alert.    SH": + tobacco       Assessment & Plan:

## 2022-06-05 LAB — CBC WITH DIFFERENTIAL/PLATELET
Absolute Monocytes: 684 cells/uL (ref 200–950)
Basophils Absolute: 99 cells/uL (ref 0–200)
Basophils Relative: 1.1 %
Eosinophils Absolute: 414 cells/uL (ref 15–500)
Eosinophils Relative: 4.6 %
HCT: 39.4 % (ref 38.5–50.0)
Hemoglobin: 13.6 g/dL (ref 13.2–17.1)
Lymphs Abs: 1791 cells/uL (ref 850–3900)
MCH: 29.2 pg (ref 27.0–33.0)
MCHC: 34.5 g/dL (ref 32.0–36.0)
MCV: 84.7 fL (ref 80.0–100.0)
MPV: 10.1 fL (ref 7.5–12.5)
Monocytes Relative: 7.6 %
Neutro Abs: 6012 cells/uL (ref 1500–7800)
Neutrophils Relative %: 66.8 %
Platelets: 251 10*3/uL (ref 140–400)
RBC: 4.65 10*6/uL (ref 4.20–5.80)
RDW: 12 % (ref 11.0–15.0)
Total Lymphocyte: 19.9 %
WBC: 9 10*3/uL (ref 3.8–10.8)

## 2022-06-05 LAB — COMPREHENSIVE METABOLIC PANEL
AG Ratio: 1.8 (calc) (ref 1.0–2.5)
ALT: 29 U/L (ref 9–46)
AST: 21 U/L (ref 10–40)
Albumin: 4.4 g/dL (ref 3.6–5.1)
Alkaline phosphatase (APISO): 60 U/L (ref 36–130)
BUN: 9 mg/dL (ref 7–25)
CO2: 25 mmol/L (ref 20–32)
Calcium: 9 mg/dL (ref 8.6–10.3)
Chloride: 106 mmol/L (ref 98–110)
Creat: 0.71 mg/dL (ref 0.60–1.24)
Globulin: 2.5 g/dL (calc) (ref 1.9–3.7)
Glucose, Bld: 85 mg/dL (ref 65–99)
Potassium: 3.8 mmol/L (ref 3.5–5.3)
Sodium: 141 mmol/L (ref 135–146)
Total Bilirubin: 0.7 mg/dL (ref 0.2–1.2)
Total Protein: 6.9 g/dL (ref 6.1–8.1)

## 2022-06-05 LAB — C-REACTIVE PROTEIN: CRP: 3.8 mg/L (ref <8.0)

## 2022-06-05 LAB — SEDIMENTATION RATE: Sed Rate: 6 mm/h (ref 0–15)

## 2022-06-05 LAB — CK: Total CK: 107 U/L (ref 44–196)

## 2022-06-07 ENCOUNTER — Ambulatory Visit (HOSPITAL_COMMUNITY)
Admission: RE | Admit: 2022-06-07 | Discharge: 2022-06-07 | Disposition: A | Discharge status: Home / Self Care | Payer: Commercial Managed Care - PPO | Source: Ambulatory Visit | Attending: Internal Medicine | Admitting: Internal Medicine

## 2022-06-07 ENCOUNTER — Other Ambulatory Visit: Payer: Self-pay | Admitting: Internal Medicine

## 2022-06-07 DIAGNOSIS — M86142 Other acute osteomyelitis, left hand: Secondary | ICD-10-CM | POA: Diagnosis not present

## 2022-06-07 DIAGNOSIS — M869 Osteomyelitis, unspecified: Secondary | ICD-10-CM

## 2022-06-07 DIAGNOSIS — Z452 Encounter for adjustment and management of vascular access device: Secondary | ICD-10-CM | POA: Diagnosis not present

## 2022-06-07 MED ORDER — HEPARIN SOD (PORK) LOCK FLUSH 100 UNIT/ML IV SOLN
INTRAVENOUS | Status: AC
  Administered 2022-06-07: 500 [IU]
  Filled 2022-06-07: qty 5

## 2022-06-07 MED ORDER — LIDOCAINE HCL 1 % IJ SOLN
INTRAMUSCULAR | Status: AC
  Administered 2022-06-07: 10 mL
  Filled 2022-06-07: qty 20

## 2022-06-10 DIAGNOSIS — M869 Osteomyelitis, unspecified: Secondary | ICD-10-CM | POA: Diagnosis not present

## 2022-06-14 DIAGNOSIS — M869 Osteomyelitis, unspecified: Secondary | ICD-10-CM | POA: Diagnosis not present

## 2022-06-16 ENCOUNTER — Encounter: Payer: Self-pay | Admitting: Internal Medicine

## 2022-06-16 ENCOUNTER — Ambulatory Visit: Payer: Commercial Managed Care - PPO | Admitting: Internal Medicine

## 2022-06-16 ENCOUNTER — Other Ambulatory Visit: Payer: Self-pay

## 2022-06-16 ENCOUNTER — Other Ambulatory Visit (HOSPITAL_COMMUNITY): Payer: Self-pay

## 2022-06-16 VITALS — BP 125/73 | HR 83 | Temp 98.1°F | Resp 96 | Ht 73.0 in | Wt 223.0 lb

## 2022-06-16 DIAGNOSIS — Z452 Encounter for adjustment and management of vascular access device: Secondary | ICD-10-CM | POA: Diagnosis not present

## 2022-06-16 DIAGNOSIS — Z5181 Encounter for therapeutic drug level monitoring: Secondary | ICD-10-CM | POA: Diagnosis not present

## 2022-06-16 DIAGNOSIS — M869 Osteomyelitis, unspecified: Secondary | ICD-10-CM | POA: Diagnosis not present

## 2022-06-16 MED ORDER — LINEZOLID 600 MG PO TABS
600.0000 mg | ORAL_TABLET | Freq: Two times a day (BID) | ORAL | 0 refills | Status: AC
  Filled 2022-06-16 – 2022-06-24 (×2): qty 42, 21d supply, fill #0

## 2022-06-17 ENCOUNTER — Encounter: Payer: Self-pay | Admitting: Internal Medicine

## 2022-06-17 ENCOUNTER — Other Ambulatory Visit (HOSPITAL_COMMUNITY): Payer: Self-pay

## 2022-06-17 DIAGNOSIS — Z5181 Encounter for therapeutic drug level monitoring: Secondary | ICD-10-CM | POA: Insufficient documentation

## 2022-06-17 DIAGNOSIS — M869 Osteomyelitis, unspecified: Secondary | ICD-10-CM | POA: Diagnosis not present

## 2022-06-17 DIAGNOSIS — Z452 Encounter for adjustment and management of vascular access device: Secondary | ICD-10-CM | POA: Insufficient documentation

## 2022-06-17 NOTE — Assessment & Plan Note (Signed)
Will get copies of his recent labs.

## 2022-06-17 NOTE — Progress Notes (Signed)
   Subjective:    Patient ID: William Rollins, male    DOB: 1995/11/07, 27 y.o.   MRN: [id number]  HPI Here for follow up of left index finger osteomyelitis He underwent I and D by Dr. Ala Bent and bone biopsy positive for MRSA and MRSE.  PICC line placed after discharge and IV daptomycin started.   He is tolerating this well, no concerns.  No labs available to me at this time.  Healing well.  Wrapped now and was seen by Dr. Ala Bent 2/8 and looked as expected.  No rash, no diarrhea.    Review of Systems  Gastrointestinal:  Negative for diarrhea.  Skin:  Negative for rash.       Objective:   Physical Exam Eyes:     General: No scleral icterus. Pulmonary:     Effort: Pulmonary effort is normal.  Skin:    Findings: No rash.  Neurological:     General: No focal deficit present.     Mental Status: He is alert.   SH: + tobacco        Assessment & Plan:

## 2022-06-17 NOTE — Assessment & Plan Note (Signed)
On daptomycin IV and plan to continue this for 3 weeks through 2/25 then transition to oral linezolid. OVerall seems to be doing well.   He will follow up with me in about 2 weeks

## 2022-06-17 NOTE — Assessment & Plan Note (Signed)
Picc line working well, no concerns.  Will have this removed after his last dose 2/25.

## 2022-06-24 ENCOUNTER — Other Ambulatory Visit (HOSPITAL_COMMUNITY): Payer: Self-pay

## 2022-06-24 DIAGNOSIS — M869 Osteomyelitis, unspecified: Secondary | ICD-10-CM | POA: Diagnosis not present

## 2022-06-28 LAB — FUNGUS CULTURE RESULT

## 2022-06-28 LAB — FUNGAL ORGANISM REFLEX

## 2022-06-28 LAB — FUNGUS CULTURE WITH STAIN

## 2022-06-29 ENCOUNTER — Other Ambulatory Visit: Payer: Self-pay

## 2022-06-29 ENCOUNTER — Encounter: Payer: Self-pay | Admitting: Internal Medicine

## 2022-06-29 ENCOUNTER — Ambulatory Visit: Payer: Commercial Managed Care - PPO | Admitting: Internal Medicine

## 2022-06-29 VITALS — BP 111/69 | HR 76 | Temp 98.0°F | Ht 73.0 in | Wt 229.0 lb

## 2022-06-29 DIAGNOSIS — Z452 Encounter for adjustment and management of vascular access device: Secondary | ICD-10-CM

## 2022-06-29 DIAGNOSIS — M869 Osteomyelitis, unspecified: Secondary | ICD-10-CM

## 2022-06-29 DIAGNOSIS — Z5181 Encounter for therapeutic drug level monitoring: Secondary | ICD-10-CM

## 2022-06-29 NOTE — Assessment & Plan Note (Signed)
This was removed and no issues at insertion site.

## 2022-06-29 NOTE — Assessment & Plan Note (Signed)
I will check his labs today and in 2 weeks on linezolid.

## 2022-06-29 NOTE — Assessment & Plan Note (Signed)
It is healing well with no new issues.  I will recheck his inflammatory markers I plan for him to continue with linezolid for 3 weeks now He will continue to follow with the surgeon and will call here as needed

## 2022-06-29 NOTE — Progress Notes (Signed)
   Subjective:    Patient ID: William Rollins, male    DOB: 1995/07/18, 27 y.o.   MRN: [id number]  HPI William Rollins is here for follow up of osteomyelitis of his left pointer finger.  He underwent I and D by Dr. Ala Bent and bone biopsy positive for MRSA and MRSE.  PICC line placed after discharge and he was treated with IV daptomycin for 3 weeks through yesterday, 2/26.   He did well with no concerns.  Finger healing well.   Losing his insurance in March.    Review of Systems  Constitutional:  Negative for fatigue and fever.  Gastrointestinal:  Negative for nausea.  Skin:  Negative for rash.       Objective:   Physical Exam Eyes:     General: No scleral icterus. Pulmonary:     Effort: Pulmonary effort is normal.  Musculoskeletal:     Comments: Left finger with dried scabbing, no warmth, no significant swelling  Skin:    Findings: No rash.  Neurological:     General: No focal deficit present.     Mental Status: He is alert.   SH: + tobacco        Assessment & Plan:

## 2022-06-30 LAB — COMPLETE METABOLIC PANEL WITH GFR
AG Ratio: 1.9 (calc) (ref 1.0–2.5)
ALT: 32 U/L (ref 9–46)
AST: 24 U/L (ref 10–40)
Albumin: 4.8 g/dL (ref 3.6–5.1)
Alkaline phosphatase (APISO): 67 U/L (ref 36–130)
BUN: 14 mg/dL (ref 7–25)
CO2: 26 mmol/L (ref 20–32)
Calcium: 9.9 mg/dL (ref 8.6–10.3)
Chloride: 104 mmol/L (ref 98–110)
Creat: 0.88 mg/dL (ref 0.60–1.24)
Globulin: 2.5 g/dL (calc) (ref 1.9–3.7)
Glucose, Bld: 68 mg/dL (ref 65–99)
Potassium: 4.1 mmol/L (ref 3.5–5.3)
Sodium: 140 mmol/L (ref 135–146)
Total Bilirubin: 0.6 mg/dL (ref 0.2–1.2)
Total Protein: 7.3 g/dL (ref 6.1–8.1)
eGFR: 122 mL/min/{1.73_m2} (ref >=60)

## 2022-06-30 LAB — CBC
HCT: 43.5 % (ref 38.5–50.0)
Hemoglobin: 14.4 g/dL (ref 13.2–17.1)
MCH: 28.6 pg (ref 27.0–33.0)
MCHC: 33.1 g/dL (ref 32.0–36.0)
MCV: 86.5 fL (ref 80.0–100.0)
MPV: 9.9 fL (ref 7.5–12.5)
Platelets: 250 10*3/uL (ref 140–400)
RBC: 5.03 10*6/uL (ref 4.20–5.80)
RDW: 12.1 % (ref 11.0–15.0)
WBC: 8.1 10*3/uL (ref 3.8–10.8)

## 2022-06-30 LAB — C-REACTIVE PROTEIN: CRP: 1.4 mg/L (ref <8.0)

## 2022-06-30 LAB — SEDIMENTATION RATE: Sed Rate: 2 mm/h (ref 0–15)

## 2022-07-12 LAB — ACID FAST CULTURE WITH REFLEXED SENSITIVITIES (MYCOBACTERIA): Acid Fast Culture: NEGATIVE

## 2022-07-13 ENCOUNTER — Other Ambulatory Visit: Payer: Self-pay

## 2022-07-15 LAB — ACID FAST CULTURE WITH REFLEXED SENSITIVITIES (MYCOBACTERIA): Acid Fast Culture: NEGATIVE
